# Patient Record
Sex: Female | Born: 1945 | Hispanic: No | Marital: Married | Smoking: Never smoker
Health system: Southern US, Community
[De-identification: ages and names within clinical notes are randomized; demographics above are authoritative.]

## PROBLEM LIST (undated history)

## (undated) DIAGNOSIS — K7460 Unspecified cirrhosis of liver: Secondary | ICD-10-CM

## (undated) DIAGNOSIS — F32A Depression, unspecified: Secondary | ICD-10-CM

## (undated) DIAGNOSIS — I10 Essential (primary) hypertension: Secondary | ICD-10-CM

## (undated) HISTORY — PX: APPENDECTOMY: SHX54

## (undated) HISTORY — PX: TONSILLECTOMY: SUR1361

## (undated) HISTORY — PX: ABDOMINAL HYSTERECTOMY: SHX81

---

## 2000-09-16 ENCOUNTER — Other Ambulatory Visit: Admission: RE | Admit: 2000-09-16 | Discharge: 2000-09-16 | Payer: Self-pay | Admitting: Family Medicine

## 2000-10-05 ENCOUNTER — Other Ambulatory Visit: Admission: RE | Admit: 2000-10-05 | Discharge: 2000-10-05 | Payer: Self-pay | Admitting: *Deleted

## 2000-10-27 ENCOUNTER — Other Ambulatory Visit: Admission: RE | Admit: 2000-10-27 | Discharge: 2000-10-27 | Payer: Self-pay | Admitting: *Deleted

## 2000-12-20 ENCOUNTER — Other Ambulatory Visit: Admission: RE | Admit: 2000-12-20 | Discharge: 2000-12-20 | Payer: Self-pay | Admitting: *Deleted

## 2017-08-10 DIAGNOSIS — K7469 Other cirrhosis of liver: Secondary | ICD-10-CM | POA: Diagnosis present

## 2019-09-29 DIAGNOSIS — E7800 Pure hypercholesterolemia, unspecified: Secondary | ICD-10-CM | POA: Diagnosis not present

## 2019-09-29 DIAGNOSIS — K219 Gastro-esophageal reflux disease without esophagitis: Secondary | ICD-10-CM | POA: Diagnosis not present

## 2019-09-29 DIAGNOSIS — G4700 Insomnia, unspecified: Secondary | ICD-10-CM | POA: Diagnosis not present

## 2019-09-29 DIAGNOSIS — L659 Nonscarring hair loss, unspecified: Secondary | ICD-10-CM | POA: Diagnosis not present

## 2019-09-29 DIAGNOSIS — K7460 Unspecified cirrhosis of liver: Secondary | ICD-10-CM | POA: Diagnosis not present

## 2019-09-29 DIAGNOSIS — E119 Type 2 diabetes mellitus without complications: Secondary | ICD-10-CM | POA: Diagnosis not present

## 2019-12-06 DIAGNOSIS — E118 Type 2 diabetes mellitus with unspecified complications: Secondary | ICD-10-CM | POA: Diagnosis not present

## 2020-01-03 DIAGNOSIS — G4700 Insomnia, unspecified: Secondary | ICD-10-CM | POA: Diagnosis not present

## 2020-01-03 DIAGNOSIS — E1169 Type 2 diabetes mellitus with other specified complication: Secondary | ICD-10-CM | POA: Diagnosis not present

## 2020-01-03 DIAGNOSIS — E7800 Pure hypercholesterolemia, unspecified: Secondary | ICD-10-CM | POA: Diagnosis not present

## 2020-01-03 DIAGNOSIS — Z6829 Body mass index (BMI) 29.0-29.9, adult: Secondary | ICD-10-CM | POA: Diagnosis not present

## 2020-01-03 DIAGNOSIS — Z1331 Encounter for screening for depression: Secondary | ICD-10-CM | POA: Diagnosis not present

## 2020-01-03 DIAGNOSIS — K7460 Unspecified cirrhosis of liver: Secondary | ICD-10-CM | POA: Diagnosis not present

## 2020-01-03 DIAGNOSIS — Z23 Encounter for immunization: Secondary | ICD-10-CM | POA: Diagnosis not present

## 2020-01-03 DIAGNOSIS — F322 Major depressive disorder, single episode, severe without psychotic features: Secondary | ICD-10-CM | POA: Diagnosis not present

## 2020-01-08 DIAGNOSIS — E119 Type 2 diabetes mellitus without complications: Secondary | ICD-10-CM | POA: Diagnosis not present

## 2020-01-18 DIAGNOSIS — K5730 Diverticulosis of large intestine without perforation or abscess without bleeding: Secondary | ICD-10-CM | POA: Diagnosis not present

## 2020-01-18 DIAGNOSIS — K2970 Gastritis, unspecified, without bleeding: Secondary | ICD-10-CM | POA: Diagnosis not present

## 2020-01-18 DIAGNOSIS — K8020 Calculus of gallbladder without cholecystitis without obstruction: Secondary | ICD-10-CM | POA: Diagnosis not present

## 2020-01-29 DIAGNOSIS — K7460 Unspecified cirrhosis of liver: Secondary | ICD-10-CM | POA: Diagnosis not present

## 2020-02-06 DIAGNOSIS — Z1159 Encounter for screening for other viral diseases: Secondary | ICD-10-CM | POA: Diagnosis not present

## 2020-02-07 DIAGNOSIS — E1129 Type 2 diabetes mellitus with other diabetic kidney complication: Secondary | ICD-10-CM | POA: Diagnosis not present

## 2020-02-13 DIAGNOSIS — K766 Portal hypertension: Secondary | ICD-10-CM | POA: Diagnosis not present

## 2020-02-13 DIAGNOSIS — K3189 Other diseases of stomach and duodenum: Secondary | ICD-10-CM | POA: Diagnosis not present

## 2020-02-13 DIAGNOSIS — K259 Gastric ulcer, unspecified as acute or chronic, without hemorrhage or perforation: Secondary | ICD-10-CM | POA: Diagnosis not present

## 2020-02-13 DIAGNOSIS — K7460 Unspecified cirrhosis of liver: Secondary | ICD-10-CM | POA: Diagnosis not present

## 2020-02-13 DIAGNOSIS — Z79899 Other long term (current) drug therapy: Secondary | ICD-10-CM | POA: Diagnosis not present

## 2020-02-13 DIAGNOSIS — F329 Major depressive disorder, single episode, unspecified: Secondary | ICD-10-CM | POA: Diagnosis not present

## 2020-02-13 DIAGNOSIS — I8500 Esophageal varices without bleeding: Secondary | ICD-10-CM | POA: Diagnosis not present

## 2020-02-13 DIAGNOSIS — K219 Gastro-esophageal reflux disease without esophagitis: Secondary | ICD-10-CM | POA: Diagnosis not present

## 2020-02-13 DIAGNOSIS — R935 Abnormal findings on diagnostic imaging of other abdominal regions, including retroperitoneum: Secondary | ICD-10-CM | POA: Diagnosis not present

## 2020-02-13 DIAGNOSIS — I8510 Secondary esophageal varices without bleeding: Secondary | ICD-10-CM | POA: Diagnosis not present

## 2020-03-08 DIAGNOSIS — E119 Type 2 diabetes mellitus without complications: Secondary | ICD-10-CM | POA: Diagnosis not present

## 2020-04-04 DIAGNOSIS — Z79899 Other long term (current) drug therapy: Secondary | ICD-10-CM | POA: Diagnosis not present

## 2020-04-04 DIAGNOSIS — Z6828 Body mass index (BMI) 28.0-28.9, adult: Secondary | ICD-10-CM | POA: Diagnosis not present

## 2020-04-04 DIAGNOSIS — F322 Major depressive disorder, single episode, severe without psychotic features: Secondary | ICD-10-CM | POA: Diagnosis not present

## 2020-04-04 DIAGNOSIS — K219 Gastro-esophageal reflux disease without esophagitis: Secondary | ICD-10-CM | POA: Diagnosis not present

## 2020-04-04 DIAGNOSIS — G4700 Insomnia, unspecified: Secondary | ICD-10-CM | POA: Diagnosis not present

## 2020-04-04 DIAGNOSIS — Z139 Encounter for screening, unspecified: Secondary | ICD-10-CM | POA: Diagnosis not present

## 2020-04-04 DIAGNOSIS — E1169 Type 2 diabetes mellitus with other specified complication: Secondary | ICD-10-CM | POA: Diagnosis not present

## 2020-04-04 DIAGNOSIS — E7800 Pure hypercholesterolemia, unspecified: Secondary | ICD-10-CM | POA: Diagnosis not present

## 2020-04-04 DIAGNOSIS — Z9181 History of falling: Secondary | ICD-10-CM | POA: Diagnosis not present

## 2020-04-07 DIAGNOSIS — E119 Type 2 diabetes mellitus without complications: Secondary | ICD-10-CM | POA: Diagnosis not present

## 2020-04-11 DIAGNOSIS — K7460 Unspecified cirrhosis of liver: Secondary | ICD-10-CM | POA: Diagnosis not present

## 2020-04-11 DIAGNOSIS — K5730 Diverticulosis of large intestine without perforation or abscess without bleeding: Secondary | ICD-10-CM | POA: Diagnosis not present

## 2020-04-11 DIAGNOSIS — K219 Gastro-esophageal reflux disease without esophagitis: Secondary | ICD-10-CM | POA: Diagnosis not present

## 2020-04-23 DIAGNOSIS — Z20828 Contact with and (suspected) exposure to other viral communicable diseases: Secondary | ICD-10-CM | POA: Diagnosis not present

## 2020-04-26 DIAGNOSIS — K219 Gastro-esophageal reflux disease without esophagitis: Secondary | ICD-10-CM | POA: Diagnosis not present

## 2020-04-26 DIAGNOSIS — I10 Essential (primary) hypertension: Secondary | ICD-10-CM | POA: Diagnosis not present

## 2020-04-26 DIAGNOSIS — I8500 Esophageal varices without bleeding: Secondary | ICD-10-CM | POA: Diagnosis not present

## 2020-04-26 DIAGNOSIS — E785 Hyperlipidemia, unspecified: Secondary | ICD-10-CM | POA: Diagnosis not present

## 2020-04-26 DIAGNOSIS — K7460 Unspecified cirrhosis of liver: Secondary | ICD-10-CM | POA: Diagnosis not present

## 2020-04-26 DIAGNOSIS — R935 Abnormal findings on diagnostic imaging of other abdominal regions, including retroperitoneum: Secondary | ICD-10-CM | POA: Diagnosis not present

## 2020-04-26 DIAGNOSIS — Z6831 Body mass index (BMI) 31.0-31.9, adult: Secondary | ICD-10-CM | POA: Diagnosis not present

## 2020-04-26 DIAGNOSIS — Z79899 Other long term (current) drug therapy: Secondary | ICD-10-CM | POA: Diagnosis not present

## 2020-05-07 DIAGNOSIS — E119 Type 2 diabetes mellitus without complications: Secondary | ICD-10-CM | POA: Diagnosis not present

## 2020-06-06 DIAGNOSIS — E119 Type 2 diabetes mellitus without complications: Secondary | ICD-10-CM | POA: Diagnosis not present

## 2020-07-04 DIAGNOSIS — K7460 Unspecified cirrhosis of liver: Secondary | ICD-10-CM | POA: Diagnosis not present

## 2020-07-04 DIAGNOSIS — F32A Depression, unspecified: Secondary | ICD-10-CM | POA: Diagnosis not present

## 2020-07-04 DIAGNOSIS — E1169 Type 2 diabetes mellitus with other specified complication: Secondary | ICD-10-CM | POA: Diagnosis not present

## 2020-07-04 DIAGNOSIS — E7800 Pure hypercholesterolemia, unspecified: Secondary | ICD-10-CM | POA: Diagnosis not present

## 2020-07-04 DIAGNOSIS — R42 Dizziness and giddiness: Secondary | ICD-10-CM | POA: Diagnosis not present

## 2020-07-04 DIAGNOSIS — Z6828 Body mass index (BMI) 28.0-28.9, adult: Secondary | ICD-10-CM | POA: Diagnosis not present

## 2020-07-04 DIAGNOSIS — G4700 Insomnia, unspecified: Secondary | ICD-10-CM | POA: Diagnosis not present

## 2020-07-04 DIAGNOSIS — K219 Gastro-esophageal reflux disease without esophagitis: Secondary | ICD-10-CM | POA: Diagnosis not present

## 2020-07-05 DIAGNOSIS — H2513 Age-related nuclear cataract, bilateral: Secondary | ICD-10-CM | POA: Diagnosis not present

## 2020-07-05 DIAGNOSIS — E119 Type 2 diabetes mellitus without complications: Secondary | ICD-10-CM | POA: Diagnosis not present

## 2020-07-06 DIAGNOSIS — E119 Type 2 diabetes mellitus without complications: Secondary | ICD-10-CM | POA: Diagnosis not present

## 2020-07-09 DIAGNOSIS — Z0100 Encounter for examination of eyes and vision without abnormal findings: Secondary | ICD-10-CM | POA: Diagnosis not present

## 2020-08-05 DIAGNOSIS — E119 Type 2 diabetes mellitus without complications: Secondary | ICD-10-CM | POA: Diagnosis not present

## 2020-09-04 DIAGNOSIS — E119 Type 2 diabetes mellitus without complications: Secondary | ICD-10-CM | POA: Diagnosis not present

## 2020-10-04 DIAGNOSIS — E1169 Type 2 diabetes mellitus with other specified complication: Secondary | ICD-10-CM | POA: Diagnosis not present

## 2020-10-04 DIAGNOSIS — K7460 Unspecified cirrhosis of liver: Secondary | ICD-10-CM | POA: Diagnosis not present

## 2020-10-04 DIAGNOSIS — E119 Type 2 diabetes mellitus without complications: Secondary | ICD-10-CM | POA: Diagnosis not present

## 2020-10-04 DIAGNOSIS — N1831 Chronic kidney disease, stage 3a: Secondary | ICD-10-CM | POA: Diagnosis not present

## 2020-10-04 DIAGNOSIS — E7800 Pure hypercholesterolemia, unspecified: Secondary | ICD-10-CM | POA: Diagnosis not present

## 2020-10-05 DIAGNOSIS — E119 Type 2 diabetes mellitus without complications: Secondary | ICD-10-CM | POA: Diagnosis not present

## 2020-10-14 DIAGNOSIS — Z1231 Encounter for screening mammogram for malignant neoplasm of breast: Secondary | ICD-10-CM | POA: Diagnosis not present

## 2020-10-28 DIAGNOSIS — I8500 Esophageal varices without bleeding: Secondary | ICD-10-CM | POA: Diagnosis not present

## 2020-11-04 DIAGNOSIS — E119 Type 2 diabetes mellitus without complications: Secondary | ICD-10-CM | POA: Diagnosis not present

## 2020-12-05 DIAGNOSIS — E119 Type 2 diabetes mellitus without complications: Secondary | ICD-10-CM | POA: Diagnosis not present

## 2021-01-04 DIAGNOSIS — E119 Type 2 diabetes mellitus without complications: Secondary | ICD-10-CM | POA: Diagnosis not present

## 2021-01-06 DIAGNOSIS — I10 Essential (primary) hypertension: Secondary | ICD-10-CM | POA: Diagnosis not present

## 2021-01-06 DIAGNOSIS — K7460 Unspecified cirrhosis of liver: Secondary | ICD-10-CM | POA: Diagnosis not present

## 2021-01-06 DIAGNOSIS — Z23 Encounter for immunization: Secondary | ICD-10-CM | POA: Diagnosis not present

## 2021-01-06 DIAGNOSIS — N1831 Chronic kidney disease, stage 3a: Secondary | ICD-10-CM | POA: Diagnosis not present

## 2021-01-06 DIAGNOSIS — E7800 Pure hypercholesterolemia, unspecified: Secondary | ICD-10-CM | POA: Diagnosis not present

## 2021-01-06 DIAGNOSIS — E1169 Type 2 diabetes mellitus with other specified complication: Secondary | ICD-10-CM | POA: Diagnosis not present

## 2021-01-06 DIAGNOSIS — G4700 Insomnia, unspecified: Secondary | ICD-10-CM | POA: Diagnosis not present

## 2021-02-04 DIAGNOSIS — E119 Type 2 diabetes mellitus without complications: Secondary | ICD-10-CM | POA: Diagnosis not present

## 2021-03-03 ENCOUNTER — Other Ambulatory Visit: Payer: Self-pay

## 2021-03-03 ENCOUNTER — Encounter (HOSPITAL_COMMUNITY): Payer: Self-pay | Admitting: Family Medicine

## 2021-03-03 ENCOUNTER — Emergency Department (HOSPITAL_COMMUNITY): Payer: Medicare Other

## 2021-03-03 ENCOUNTER — Inpatient Hospital Stay (HOSPITAL_COMMUNITY)
Admission: EM | Admit: 2021-03-03 | Discharge: 2021-03-07 | DRG: 522 | Disposition: A | Discharge status: Skilled Nursing Facility | Payer: Medicare Other | Attending: Internal Medicine | Admitting: Internal Medicine

## 2021-03-03 DIAGNOSIS — W19XXXA Unspecified fall, initial encounter: Secondary | ICD-10-CM | POA: Diagnosis not present

## 2021-03-03 DIAGNOSIS — F419 Anxiety disorder, unspecified: Secondary | ICD-10-CM | POA: Diagnosis not present

## 2021-03-03 DIAGNOSIS — E785 Hyperlipidemia, unspecified: Secondary | ICD-10-CM | POA: Diagnosis present

## 2021-03-03 DIAGNOSIS — R739 Hyperglycemia, unspecified: Secondary | ICD-10-CM | POA: Diagnosis present

## 2021-03-03 DIAGNOSIS — D62 Acute posthemorrhagic anemia: Secondary | ICD-10-CM | POA: Diagnosis not present

## 2021-03-03 DIAGNOSIS — D649 Anemia, unspecified: Secondary | ICD-10-CM | POA: Diagnosis present

## 2021-03-03 DIAGNOSIS — E119 Type 2 diabetes mellitus without complications: Secondary | ICD-10-CM | POA: Diagnosis not present

## 2021-03-03 DIAGNOSIS — S72041A Displaced fracture of base of neck of right femur, initial encounter for closed fracture: Secondary | ICD-10-CM | POA: Diagnosis not present

## 2021-03-03 DIAGNOSIS — S72041D Displaced fracture of base of neck of right femur, subsequent encounter for closed fracture with routine healing: Secondary | ICD-10-CM | POA: Diagnosis not present

## 2021-03-03 DIAGNOSIS — Z9181 History of falling: Secondary | ICD-10-CM | POA: Diagnosis not present

## 2021-03-03 DIAGNOSIS — S79929A Unspecified injury of unspecified thigh, initial encounter: Secondary | ICD-10-CM | POA: Diagnosis not present

## 2021-03-03 DIAGNOSIS — W109XXA Fall (on) (from) unspecified stairs and steps, initial encounter: Secondary | ICD-10-CM | POA: Diagnosis present

## 2021-03-03 DIAGNOSIS — R6889 Other general symptoms and signs: Secondary | ICD-10-CM | POA: Diagnosis not present

## 2021-03-03 DIAGNOSIS — F32A Depression, unspecified: Secondary | ICD-10-CM | POA: Diagnosis not present

## 2021-03-03 DIAGNOSIS — R7302 Impaired glucose tolerance (oral): Secondary | ICD-10-CM | POA: Diagnosis present

## 2021-03-03 DIAGNOSIS — Z4789 Encounter for other orthopedic aftercare: Secondary | ICD-10-CM | POA: Diagnosis not present

## 2021-03-03 DIAGNOSIS — Z20822 Contact with and (suspected) exposure to covid-19: Secondary | ICD-10-CM | POA: Diagnosis present

## 2021-03-03 DIAGNOSIS — M1612 Unilateral primary osteoarthritis, left hip: Secondary | ICD-10-CM | POA: Diagnosis not present

## 2021-03-03 DIAGNOSIS — R9431 Abnormal electrocardiogram [ECG] [EKG]: Secondary | ICD-10-CM | POA: Diagnosis not present

## 2021-03-03 DIAGNOSIS — I080 Rheumatic disorders of both mitral and aortic valves: Secondary | ICD-10-CM | POA: Diagnosis present

## 2021-03-03 DIAGNOSIS — M25572 Pain in left ankle and joints of left foot: Secondary | ICD-10-CM | POA: Diagnosis not present

## 2021-03-03 DIAGNOSIS — Z743 Need for continuous supervision: Secondary | ICD-10-CM | POA: Diagnosis not present

## 2021-03-03 DIAGNOSIS — S79911A Unspecified injury of right hip, initial encounter: Secondary | ICD-10-CM | POA: Diagnosis not present

## 2021-03-03 DIAGNOSIS — K760 Fatty (change of) liver, not elsewhere classified: Secondary | ICD-10-CM | POA: Diagnosis present

## 2021-03-03 DIAGNOSIS — Z471 Aftercare following joint replacement surgery: Secondary | ICD-10-CM | POA: Diagnosis not present

## 2021-03-03 DIAGNOSIS — R0902 Hypoxemia: Secondary | ICD-10-CM | POA: Diagnosis not present

## 2021-03-03 DIAGNOSIS — S72001A Fracture of unspecified part of neck of right femur, initial encounter for closed fracture: Principal | ICD-10-CM | POA: Diagnosis present

## 2021-03-03 DIAGNOSIS — M25551 Pain in right hip: Secondary | ICD-10-CM | POA: Diagnosis not present

## 2021-03-03 DIAGNOSIS — S72002A Fracture of unspecified part of neck of left femur, initial encounter for closed fracture: Secondary | ICD-10-CM | POA: Diagnosis not present

## 2021-03-03 DIAGNOSIS — Z79899 Other long term (current) drug therapy: Secondary | ICD-10-CM | POA: Diagnosis not present

## 2021-03-03 DIAGNOSIS — Z96641 Presence of right artificial hip joint: Secondary | ICD-10-CM | POA: Diagnosis not present

## 2021-03-03 DIAGNOSIS — M6281 Muscle weakness (generalized): Secondary | ICD-10-CM | POA: Diagnosis not present

## 2021-03-03 DIAGNOSIS — R2689 Other abnormalities of gait and mobility: Secondary | ICD-10-CM | POA: Diagnosis not present

## 2021-03-03 DIAGNOSIS — I517 Cardiomegaly: Secondary | ICD-10-CM | POA: Diagnosis not present

## 2021-03-03 DIAGNOSIS — Z419 Encounter for procedure for purposes other than remedying health state, unspecified: Secondary | ICD-10-CM

## 2021-03-03 HISTORY — DX: Depression, unspecified: F32.A

## 2021-03-03 HISTORY — DX: Essential (primary) hypertension: I10

## 2021-03-03 HISTORY — DX: Unspecified cirrhosis of liver: K74.60

## 2021-03-03 LAB — BASIC METABOLIC PANEL
Anion gap: 9 (ref 5–15)
BUN: 15 mg/dL (ref 8–23)
CO2: 26 mmol/L (ref 22–32)
Calcium: 8.9 mg/dL (ref 8.9–10.3)
Chloride: 103 mmol/L (ref 98–111)
Creatinine, Ser: 1 mg/dL (ref 0.44–1.00)
GFR, Estimated: 59 mL/min — ABNORMAL LOW (ref >60)
Glucose, Bld: 164 mg/dL — ABNORMAL HIGH (ref 70–99)
Potassium: 4.3 mmol/L (ref 3.5–5.1)
Sodium: 138 mmol/L (ref 135–145)

## 2021-03-03 LAB — CBC WITH DIFFERENTIAL/PLATELET
Abs Immature Granulocytes: 0.03 10*3/uL (ref 0.00–0.07)
Basophils Absolute: 0 10*3/uL (ref 0.0–0.1)
Basophils Relative: 0 %
Eosinophils Absolute: 0.2 10*3/uL (ref 0.0–0.5)
Eosinophils Relative: 3 %
HCT: 35.4 % — ABNORMAL LOW (ref 36.0–46.0)
Hemoglobin: 10.7 g/dL — ABNORMAL LOW (ref 12.0–15.0)
Immature Granulocytes: 1 %
Lymphocytes Relative: 10 %
Lymphs Abs: 0.7 10*3/uL (ref 0.7–4.0)
MCH: 28.2 pg (ref 26.0–34.0)
MCHC: 30.2 g/dL (ref 30.0–36.0)
MCV: 93.2 fL (ref 80.0–100.0)
Monocytes Absolute: 0.3 10*3/uL (ref 0.1–1.0)
Monocytes Relative: 4 %
Neutro Abs: 5.5 10*3/uL (ref 1.7–7.7)
Neutrophils Relative %: 82 %
Platelets: 111 10*3/uL — ABNORMAL LOW (ref 150–400)
RBC: 3.8 MIL/uL — ABNORMAL LOW (ref 3.87–5.11)
RDW: 15.4 % (ref 11.5–15.5)
WBC: 6.7 10*3/uL (ref 4.0–10.5)
nRBC: 0 % (ref 0.0–0.2)

## 2021-03-03 LAB — RESP PANEL BY RT-PCR (FLU A&B, COVID) ARPGX2
Influenza A by PCR: NEGATIVE
Influenza B by PCR: NEGATIVE
SARS Coronavirus 2 by RT PCR: NEGATIVE

## 2021-03-03 LAB — ABO/RH: ABO/RH(D): O POS

## 2021-03-03 LAB — PROTIME-INR
INR: 1.1 (ref 0.8–1.2)
Prothrombin Time: 14.6 seconds (ref 11.4–15.2)

## 2021-03-03 MED ORDER — FENTANYL CITRATE PF 50 MCG/ML IJ SOSY
50.0000 ug | PREFILLED_SYRINGE | INTRAMUSCULAR | Status: AC | PRN
  Administered 2021-03-03 – 2021-03-04 (×2): 50 ug via INTRAVENOUS
  Filled 2021-03-03 (×2): qty 1

## 2021-03-03 NOTE — Progress Notes (Signed)
Ortho Note  Received consult regarding this patient. She has a displaced femoral neck fracture and will require arthroplasty. I have asked for admission to the hospitalist with plans for NPO at midnight. Surgery will be tomorrow pending surgeon and OR availability. Formal consult to follow tomorrow AM.  Roby Lofts, MD [facility] [phone] (office) [email or web address]

## 2021-03-03 NOTE — H&P (Signed)
History and Physical    Alexis Cox ERD:[id number] DOB: [birth year] DOA: 03/03/2021  PCP: No primary care provider on file.   Patient coming from: Home  Chief Complaint: Right hip pain after fall  HPI: Alexis Cox is a 75 y.o. female with medical history significant for impaired glucose tolerance is diet controlled, fatty liver disease who presents by EMS after a fall at home.  Approximate hour before arrival to the emergency room patient was walking in her house when she fell landing on her right side.  She instantaneously had severe pain in her right hip and was unable to stand up.  Pain was sharp.  Was brought to the hospital by EMS and found to have a right femoral neck fracture.  He was given pain medication by EMS personnel.  Pain is controlled as long as she is laying still not moving her right leg.  She has normal feeling in her right leg and normal pulses in both feet.  She denies any head injury or loss of consciousness.  Chest pain, palpitation, shortness of breath.  No recent fever or illness. Lives with her husband.  Denies tobacco alcohol or illicit drug use.  ED Course: She has been hemodynamically stable in the emergency room.  She is found to have right femoral neck fracture that is displaced.  Orthopedic surgeries been consulted and will see patient and prepare for operative repair.  EKG shows no ischemic changes.  COVID swab is pending.  Sodium 138 potassium 4.3 chloride 103 bicarb 26 creatinine 1.00 BUN 15 glucose 164 INR 1.1.  CBC pending.  Hospitalist service has been asked to admit patient for further management  Review of Systems:  General: Denies fever, chills, weight loss, night sweats.  Denies dizziness.  Denies change in appetite HENT: Denies head trauma, headache, denies change in hearing, tinnitus.  Denies nasal congestion or bleeding.  Denies sore throat, sores in mouth.  Denies difficulty swallowing Eyes: Denies blurry vision, pain in eye, drainage.   Denies discoloration of eyes. Neck: Denies pain.  Denies swelling.  Denies pain with movement. Cardiovascular: Denies chest pain, palpitations.  Denies edema.  Denies orthopnea Respiratory: Denies shortness of breath, cough.  Denies wheezing.  Denies sputum production Gastrointestinal: Denies abdominal pain, swelling.  Denies nausea, vomiting, diarrhea.  Denies melena.  Denies hematemesis. Musculoskeletal: Reports limitation of movement right leg.  Pain in right hip..  Denies deformity or swelling.   Genitourinary: Denies pelvic pain.  Denies urinary frequency or hesitancy.  Denies dysuria.  Skin: Denies rash.  Denies petechiae, purpura, ecchymosis. Neurological: Denies syncope.  Denies seizure activity. Denies paresthesia. Denies slurred speech, drooping face.  Denies visual change. Psychiatric: Denies depression, anxiety. Denies hallucinations.  History reviewed. No pertinent past medical history.  History reviewed. No pertinent surgical history.  Social History  reports that she has never smoked. She has never used smokeless tobacco. She reports that she does not drink alcohol. No history on file for drug use.  No Known Allergies  History reviewed. No pertinent family history.   Prior to Admission medications   Not on File    Physical Exam: Vitals:   03/03/21 1902  BP: (!) 151/89  Pulse: 64  Resp: 16  Temp: (!) 97.4 F (36.3 C)  TempSrc: Oral  SpO2: 94%    Constitutional: NAD, calm, comfortable Vitals:   03/03/21 1902  BP: (!) 151/89  Pulse: 64  Resp: 16  Temp: (!) 97.4 F (36.3 C)  TempSrc: Oral  SpO2: 94%  General: WDWN, Alert and oriented x3.  Eyes: EOMI, PERRL, conjunctivae normal.  Sclera nonicteric HENT:  [place]/AT, external ears normal.  Nares patent without epistasis.  Mucous membranes are moist.  Neck: Soft, normal range of motion, supple, no masses, no thyromegaly.  Trachea midline Respiratory: clear to auscultation bilaterally, no wheezing, no  crackles. Normal respiratory effort. No accessory muscle use.  Cardiovascular: Regular rate and rhythm, no murmurs / rubs / gallops. No extremity edema. 2+ pedal pulses. Abdomen: Soft, no tenderness, nondistended, no rebound or guarding.  No masses palpated. Bowel sounds normoactive Musculoskeletal: Right leg shortened and externally rotated.  All other extremities with normal range of motion no cyanosis. No joint deformity upper and lower extremities. Normal muscle tone.  Skin: Warm, dry, intact no rashes, lesions, ulcers. No induration Neurologic: CN 2-12 grossly intact.  Normal speech.  Sensation intact, Strength 5/5 in upper extremities and left lower leg. Psychiatric: Normal judgment and insight.  Normal mood.    Labs on Admission: I have personally reviewed following labs and imaging studies  CBC: No results for input(s): WBC, NEUTROABS, HGB, HCT, MCV, PLT in the last 168 hours.  Basic Metabolic Panel: Recent Labs  Lab 03/03/21 2058  NA 138  K 4.3  CL 103  CO2 26  GLUCOSE 164*  BUN 15  CREATININE 1.00  CALCIUM 8.9    GFR: CrCl cannot be calculated (Unknown ideal weight.).  Liver Function Tests: No results for input(s): AST, ALT, ALKPHOS, BILITOT, PROT, ALBUMIN in the last 168 hours.  Urine analysis: No results found for: COLORURINE, APPEARANCEUR, LABSPEC, PHURINE, GLUCOSEU, HGBUR, BILIRUBINUR, KETONESUR, PROTEINUR, UROBILINOGEN, NITRITE, LEUKOCYTESUR  Radiological Exams on Admission: DG Chest 1 View  Result Date: 03/03/2021 CLINICAL DATA:  Initial evaluation for preoperative evaluation. EXAM: CHEST  1 VIEW COMPARISON:  None available. FINDINGS: Cardiomegaly. Mediastinal silhouette within normal limits. Aortic atherosclerosis. Lungs mildly hypoinflated. Chronic coarsening of the interstitial markings with diffuse pulmonary interstitial congestion. No overt pulmonary edema. No pleural effusion. No pneumothorax. No acute osseous finding. IMPRESSION: 1. Cardiomegaly with  diffuse pulmonary interstitial congestion without overt pulmonary edema. 2.  Aortic Atherosclerosis (ICD10-I70.0). Electronically Signed   By: Rise Mu M.D.   On: 03/03/2021 20:10   DG Hip Unilat With Pelvis 2-3 Views Right  Result Date: 03/03/2021 CLINICAL DATA:  Fall with hip pain EXAM: DG HIP (WITH OR WITHOUT PELVIS) 2-3V RIGHT COMPARISON:  None. FINDINGS: SI joints are non widened. Pubic symphysis and rami appear intact. Acute right femoral neck fracture with mild displacement and angulation. IMPRESSION: Acute displaced and mildly angulated right femoral neck fracture Electronically Signed   By: Jasmine Pang M.D.   On: 03/03/2021 20:10    EKG: Independently reviewed.  EKG shows normal sinus rhythm with no acute ST elevation or depression.  QTC prolonged at 481  Assessment/Plan Principal Problem:   Closed displaced fracture of right femoral neck  Alexis Cox is admitted to Med Surg floor.  Pain control with Dilaudid overnight.  Orthopedics consulted and plans to repair in OR tomorrow.  NPO after midnight per Ortho request although unknown what time will go to OR at this point.  IVF hydration with LR at 100 ml/hr overnight. Will need PT evaluation after repair of hip fracture Will need discharge planning to follow as patient may need inpatient rehab after surgery  Active Problems:   Impaired glucose tolerance History of impaired glucose tolerance that is controlled by diet.  Recheck blood sugar in the morning with labs    Prolonged QT  interval Avoid medications or could further prolong QT interval   DVT prophylaxis: SCDs for DVT prophylaxis overnight  Code Status:   Full Code  Family Communication:  Diagnosis and plan discussed with patient and her family daughter at bedside.  Questions answered.  They verbalized understanding and agree with plan.  Further recommendations to follow as clinically indicated. Disposition Plan:   Patient is from:  Home  Anticipated DC  to:  Home vs rehab, to be determined  Anticipated DC date:  Anticipate 2 midnight or more stay in the hospital   Consults called:  Orthopedics  Admission status:  Inpatient   Claudean Severance Cordell Coke MD [facility]  How to contact the [facility] Attending or Consulting provider 7A - 7P or covering provider during after hours 7P -7A, for this patient?   Check the care team in [facility] and look for a) attending/consulting [facility] provider listed and b) the [facility] team listed Log into [email or web address] and use [place]'s universal password to access. If you do not have the password, please contact the hospital operator. Locate the [facility] provider you are looking for under [facility] and page to a number that you can be directly reached. If you still have difficulty reaching the provider, please page the [facility] (Director on Call) for the Hospitalists listed on amion for assistance.  03/03/2021, 9:56 PM

## 2021-03-03 NOTE — ED Provider Notes (Signed)
[facility] Provider Note   CSN: [id number] Arrival date & time: 03/03/21  1854     History Chief Complaint  Patient presents with   Alexis Cox is a 75 y.o. female.  The history is provided by the patient.  Leg Pain Location:  Hip Time since incident:  1 hour Injury: yes   Mechanism of injury: fall   Fall:    Fall occurred:  Standing   Point of impact:  Buttocks Hip location:  R hip Pain details:    Quality:  Aching   Radiates to:  R leg   Severity:  Severe   Onset quality:  Sudden   Duration:  1 hour   Timing:  Constant   Progression:  Unchanged Dislocation: no   Prior injury to area:  No Exacerbated by: movement. Associated symptoms: no back pain and no fever       History reviewed. No pertinent past medical history.  Patient Active Problem List   Diagnosis Date Noted   Closed displaced fracture of right femoral neck ([place]) 03/03/2021   Prolonged QT interval 03/03/2021   Impaired glucose tolerance 03/03/2021     History reviewed. No pertinent surgical history.   OB History   No obstetric history on file.     History reviewed. No pertinent family history.  Social History   Tobacco Use   Smoking status: Never   Smokeless tobacco: Never  Substance Use Topics   Alcohol use: Never    Home Medications Prior to Admission medications   Medication Sig Start Date End Date Taking? Authorizing Provider  ezetimibe (ZETIA) 10 MG tablet Take 10 mg by mouth daily.   Yes [provider]  Multiple Vitamin (MULTI VITAMIN DAILY PO) Take 2 tablets by mouth daily. gummy   Yes [provider]  omeprazole (PRILOSEC) 20 MG capsule Take 20 mg by mouth daily.   Yes [provider]  traZODone (DESYREL) 50 MG tablet Take 50 mg by mouth at bedtime.   Yes [provider]    Allergies    Patient has no known allergies.  Review of Systems   Review of Systems  Constitutional:   Negative for chills and fever.  HENT:  Negative for ear pain and sore throat.   Eyes:  Negative for pain and visual disturbance.  Respiratory:  Negative for cough.   Cardiovascular:  Negative for palpitations.  Gastrointestinal:  Negative for vomiting.  Genitourinary:  Negative for dysuria and hematuria.  Musculoskeletal:  Negative for arthralgias and back pain.  Skin:  Negative for color change and rash.  Neurological:  Negative for seizures and syncope.  All other systems reviewed and are negative.  Physical Exam Updated Vital Signs BP (!) 151/89 (BP Location: Left Arm)   Pulse 64   Temp (!) 97.4 F (36.3 C) (Oral)   Resp 16   SpO2 94%   Physical Exam Vitals and nursing note reviewed.  Constitutional:      General: She is not in acute distress.    Appearance: Normal appearance. She is well-developed.  HENT:     Head: Normocephalic and atraumatic.     Right Ear: External ear normal.     Left Ear: External ear normal.     Nose: Nose normal. No congestion or rhinorrhea.     Mouth/Throat:     Mouth: Mucous membranes are moist.  Eyes:     Extraocular Movements: Extraocular movements intact.  Conjunctiva/sclera: Conjunctivae normal.     Pupils: Pupils are equal, round, and reactive to light.  Cardiovascular:     Rate and Rhythm: Normal rate and regular rhythm.     Pulses: Normal pulses.     Heart sounds: No murmur heard. Pulmonary:     Effort: Pulmonary effort is normal. No respiratory distress.     Breath sounds: Normal breath sounds. No wheezing, rhonchi or rales.  Abdominal:     General: Abdomen is flat. Bowel sounds are normal.     Palpations: Abdomen is soft.     Tenderness: There is no abdominal tenderness. There is no guarding or rebound.  Musculoskeletal:        General: Tenderness (Diffuse R hip tenderness to palpation w/out overlying wounds or skin changes. Limited ROM 2/2 pain. Full ROM R knee and ankle. 2+ DP and PT pulses. Sensation intact throughout.) and  deformity (minimal shortening and external rotation of R leg) present. No swelling.     Cervical back: Normal range of motion and neck supple. No rigidity.  Skin:    General: Skin is warm and dry.     Capillary Refill: Capillary refill takes less than 2 seconds.  Neurological:     General: No focal deficit present.     Mental Status: She is alert and oriented to person, place, and time.     Cranial Nerves: No cranial nerve deficit.     Sensory: No sensory deficit.     Motor: No weakness.     Coordination: Coordination normal.     Gait: Gait normal.  Psychiatric:        Mood and Affect: Mood normal.    ED Results / Procedures / Treatments   Labs (all labs ordered are listed, but only abnormal results are displayed) Labs Reviewed  BASIC METABOLIC PANEL - Abnormal; Notable for the following components:      Result Value   Glucose, Bld 164 (*)    GFR, Estimated 59 (*)    All other components within normal limits  CBC WITH DIFFERENTIAL/PLATELET - Abnormal; Notable for the following components:   RBC 3.80 (*)    Hemoglobin 10.7 (*)    HCT 35.4 (*)    Platelets 111 (*)    All other components within normal limits  RESP PANEL BY RT-PCR (FLU A&B, COVID) [id number]  PROTIME-INR  TYPE AND SCREEN  ABO/RH    EKG None  Radiology DG Chest 1 View  Result Date: 03/03/2021 CLINICAL DATA:  Initial evaluation for preoperative evaluation. EXAM: CHEST  1 VIEW COMPARISON:  None available. FINDINGS: Cardiomegaly. Mediastinal silhouette within normal limits. Aortic atherosclerosis. Lungs mildly hypoinflated. Chronic coarsening of the interstitial markings with diffuse pulmonary interstitial congestion. No overt pulmonary edema. No pleural effusion. No pneumothorax. No acute osseous finding. IMPRESSION: 1. Cardiomegaly with diffuse pulmonary interstitial congestion without overt pulmonary edema. 2.  Aortic Atherosclerosis (ICD10-I70.0). Electronically Signed   By: Rise Mu M.D.   On:  03/03/2021 20:10   DG Hip Unilat With Pelvis 2-3 Views Right  Result Date: 03/03/2021 CLINICAL DATA:  Fall with hip pain EXAM: DG HIP (WITH OR WITHOUT PELVIS) 2-3V RIGHT COMPARISON:  None. FINDINGS: SI joints are non widened. Pubic symphysis and rami appear intact. Acute right femoral neck fracture with mild displacement and angulation. IMPRESSION: Acute displaced and mildly angulated right femoral neck fracture Electronically Signed   By: Jasmine Pang M.D.   On: 03/03/2021 20:10    Procedures Procedures   Medications Ordered in ED  Medications  fentaNYL (SUBLIMAZE) injection 50 mcg (50 mcg Intravenous Given 03/03/21 2157)    ED Course  I have reviewed the triage vital signs and the nursing notes.  Pertinent labs & imaging results that were available during my care of the patient were reviewed by me and considered in my medical decision making (see chart for details).  Clinical Course as of 03/03/21 2341  Mon Mar 03, 2021  2350 75 year old female complaining of right hip pain after fall while she was going up the steps with her groceries.  She denies any head injury or loss consciousness.  She is not having any pain if she stays still.  Getting labs EKG and imaging.  Anticipate will need admission. [MB]    Clinical Course User Index [MB] Terrilee Files, MD   MDM Rules/Calculators/A&P                         75 y/o female presenting w/ R hip pain s/p mechanical fall.  No other signs of injury.  She not hit her head or lose consciousness.  She is not on any anticoagulation.  Found to have right femoral neck fracture.  She is neurovascular intact.  No concern for open fracture.  Orthopedics consulted.  Recommend n.p.o. at midnight for likely operative fixation in the morning.  Medicine team contacted for admission.  Final Clinical Impression(s) / ED Diagnoses Final diagnoses:  Closed displaced fracture of right femoral neck (HCC)  Fall, initial encounter    Rx / DC Orders ED  Discharge Orders     None        Lutricia Feil, MD 03/03/21 2341    Terrilee Files, MD 03/04/21 1042

## 2021-03-03 NOTE — ED Triage Notes (Signed)
PT here via [facility] after tripping and falling down 3 stairs.  No loc.  L hip shortened and rotated.  Pulses present.  Given fentanyl en-route which improved pain from 10/10 to 3.  Pt alert to self, but not year.

## 2021-03-04 ENCOUNTER — Inpatient Hospital Stay (HOSPITAL_COMMUNITY): Payer: Medicare Other

## 2021-03-04 ENCOUNTER — Encounter (HOSPITAL_COMMUNITY)
Admission: EM | Disposition: A | Discharge status: Skilled Nursing Facility | Payer: Self-pay | Source: Home / Self Care | Attending: Internal Medicine

## 2021-03-04 ENCOUNTER — Inpatient Hospital Stay (HOSPITAL_COMMUNITY): Payer: Medicare Other | Admitting: Anesthesiology

## 2021-03-04 ENCOUNTER — Encounter (HOSPITAL_COMMUNITY): Payer: Self-pay | Admitting: Family Medicine

## 2021-03-04 DIAGNOSIS — I517 Cardiomegaly: Secondary | ICD-10-CM

## 2021-03-04 DIAGNOSIS — K760 Fatty (change of) liver, not elsewhere classified: Secondary | ICD-10-CM | POA: Diagnosis not present

## 2021-03-04 DIAGNOSIS — D649 Anemia, unspecified: Secondary | ICD-10-CM

## 2021-03-04 DIAGNOSIS — Z20822 Contact with and (suspected) exposure to covid-19: Secondary | ICD-10-CM | POA: Diagnosis not present

## 2021-03-04 DIAGNOSIS — S72041A Displaced fracture of base of neck of right femur, initial encounter for closed fracture: Secondary | ICD-10-CM

## 2021-03-04 DIAGNOSIS — S72001A Fracture of unspecified part of neck of right femur, initial encounter for closed fracture: Principal | ICD-10-CM

## 2021-03-04 DIAGNOSIS — D62 Acute posthemorrhagic anemia: Secondary | ICD-10-CM | POA: Diagnosis not present

## 2021-03-04 DIAGNOSIS — R9431 Abnormal electrocardiogram [ECG] [EKG]: Secondary | ICD-10-CM

## 2021-03-04 HISTORY — PX: TOTAL HIP ARTHROPLASTY: SHX124

## 2021-03-04 LAB — BASIC METABOLIC PANEL
Anion gap: 9 (ref 5–15)
BUN: 15 mg/dL (ref 8–23)
CO2: 25 mmol/L (ref 22–32)
Calcium: 8.9 mg/dL (ref 8.9–10.3)
Chloride: 104 mmol/L (ref 98–111)
Creatinine, Ser: 1.05 mg/dL — ABNORMAL HIGH (ref 0.44–1.00)
GFR, Estimated: 55 mL/min — ABNORMAL LOW (ref >60)
Glucose, Bld: 210 mg/dL — ABNORMAL HIGH (ref 70–99)
Potassium: 3.9 mmol/L (ref 3.5–5.1)
Sodium: 138 mmol/L (ref 135–145)

## 2021-03-04 LAB — CBC
HCT: 34.3 % — ABNORMAL LOW (ref 36.0–46.0)
Hemoglobin: 10.6 g/dL — ABNORMAL LOW (ref 12.0–15.0)
MCH: 28.3 pg (ref 26.0–34.0)
MCHC: 30.9 g/dL (ref 30.0–36.0)
MCV: 91.5 fL (ref 80.0–100.0)
Platelets: 110 10*3/uL — ABNORMAL LOW (ref 150–400)
RBC: 3.75 MIL/uL — ABNORMAL LOW (ref 3.87–5.11)
RDW: 15.2 % (ref 11.5–15.5)
WBC: 6.8 10*3/uL (ref 4.0–10.5)
nRBC: 0 % (ref 0.0–0.2)

## 2021-03-04 LAB — GLUCOSE, CAPILLARY
Glucose-Capillary: 162 mg/dL — ABNORMAL HIGH (ref 70–99)
Glucose-Capillary: 184 mg/dL — ABNORMAL HIGH (ref 70–99)

## 2021-03-04 LAB — HEMOGLOBIN A1C
Hgb A1c MFr Bld: 6.3 % — ABNORMAL HIGH (ref 4.8–5.6)
Mean Plasma Glucose: 134 mg/dL

## 2021-03-04 LAB — SURGICAL PCR SCREEN
MRSA, PCR: NEGATIVE
Staphylococcus aureus: NEGATIVE

## 2021-03-04 SURGERY — ARTHROPLASTY, HIP, TOTAL, ANTERIOR APPROACH
Anesthesia: Spinal | Site: Hip | Laterality: Right

## 2021-03-04 MED ORDER — SENNOSIDES-DOCUSATE SODIUM 8.6-50 MG PO TABS: 1.0000 | ORAL_TABLET | Freq: Every evening | ORAL | Status: DC | PRN

## 2021-03-04 MED ORDER — LACTATED RINGERS IV SOLN: INTRAVENOUS | Status: DC

## 2021-03-04 MED ORDER — PROCHLORPERAZINE EDISYLATE 10 MG/2ML IJ SOLN
10.0000 mg | Freq: Four times a day (QID) | INTRAMUSCULAR | Status: DC | PRN
  Administered 2021-03-04: 10 mg via INTRAVENOUS

## 2021-03-04 MED ORDER — PROPOFOL 10 MG/ML IV BOLUS
INTRAVENOUS | Status: DC | PRN
  Administered 2021-03-04 (×3): 20 mg via INTRAVENOUS
  Administered 2021-03-04: 50 ug/kg/min via INTRAVENOUS

## 2021-03-04 MED ORDER — SODIUM CHLORIDE 0.9 % IR SOLN
Status: DC | PRN
  Administered 2021-03-04: 1000 mL

## 2021-03-04 MED ORDER — OXYCODONE HCL 5 MG PO TABS
5.0000 mg | ORAL_TABLET | ORAL | Status: DC | PRN
  Administered 2021-03-05: 5 mg via ORAL
  Administered 2021-03-05: 10 mg via ORAL
  Administered 2021-03-05: 5 mg via ORAL
  Administered 2021-03-06 – 2021-03-07 (×3): 10 mg via ORAL
  Filled 2021-03-04 (×2): qty 1
  Filled 2021-03-04 (×4): qty 2

## 2021-03-04 MED ORDER — ALUM & MAG HYDROXIDE-SIMETH 200-200-20 MG/5ML PO SUSP: 30.0000 mL | ORAL | Status: DC | PRN

## 2021-03-04 MED ORDER — ORAL CARE MOUTH RINSE: 15.0000 mL | Freq: Once | OROMUCOSAL | Status: AC

## 2021-03-04 MED ORDER — ROCURONIUM BROMIDE 10 MG/ML (PF) SYRINGE
PREFILLED_SYRINGE | INTRAVENOUS | Status: AC
  Filled 2021-03-04: qty 10

## 2021-03-04 MED ORDER — POVIDONE-IODINE 10 % EX SWAB
2.0000 "application " | Freq: Once | CUTANEOUS | Status: AC
  Administered 2021-03-04: 2 via TOPICAL

## 2021-03-04 MED ORDER — EPHEDRINE 5 MG/ML INJ
INTRAVENOUS | Status: AC
  Filled 2021-03-04: qty 5

## 2021-03-04 MED ORDER — OXYCODONE HCL 5 MG PO TABS
10.0000 mg | ORAL_TABLET | ORAL | Status: DC | PRN
  Administered 2021-03-06: 10 mg via ORAL
  Filled 2021-03-04: qty 2

## 2021-03-04 MED ORDER — ALBUMIN HUMAN 5 % IV SOLN
INTRAVENOUS | Status: AC
  Filled 2021-03-04: qty 250

## 2021-03-04 MED ORDER — METHOCARBAMOL 500 MG PO TABS
500.0000 mg | ORAL_TABLET | Freq: Four times a day (QID) | ORAL | Status: DC | PRN
  Administered 2021-03-04 – 2021-03-07 (×4): 500 mg via ORAL
  Filled 2021-03-04 (×3): qty 1

## 2021-03-04 MED ORDER — ONDANSETRON HCL 4 MG/2ML IJ SOLN
INTRAMUSCULAR | Status: DC | PRN
  Administered 2021-03-04: 4 mg via INTRAVENOUS

## 2021-03-04 MED ORDER — CELECOXIB 200 MG PO CAPS
200.0000 mg | ORAL_CAPSULE | Freq: Once | ORAL | Status: AC
  Administered 2021-03-04: 200 mg via ORAL

## 2021-03-04 MED ORDER — CHLORHEXIDINE GLUCONATE 0.12 % MT SOLN
15.0000 mL | Freq: Once | OROMUCOSAL | Status: AC
  Administered 2021-03-04: 15 mL via OROMUCOSAL

## 2021-03-04 MED ORDER — FLUOXETINE HCL 20 MG PO CAPS
20.0000 mg | ORAL_CAPSULE | Freq: Every day | ORAL | Status: DC
  Administered 2021-03-05 – 2021-03-07 (×3): 20 mg via ORAL
  Filled 2021-03-04 (×3): qty 1

## 2021-03-04 MED ORDER — METOCLOPRAMIDE HCL 5 MG/ML IJ SOLN: 5.0000 mg | Freq: Three times a day (TID) | INTRAMUSCULAR | Status: DC | PRN

## 2021-03-04 MED ORDER — INSULIN ASPART 100 UNIT/ML IJ SOLN: 0.0000 [IU] | Freq: Every day | INTRAMUSCULAR | Status: DC

## 2021-03-04 MED ORDER — TRANEXAMIC ACID-NACL 1000-0.7 MG/100ML-% IV SOLN
INTRAVENOUS | Status: AC
  Filled 2021-03-04: qty 100

## 2021-03-04 MED ORDER — FENTANYL CITRATE (PF) 100 MCG/2ML IJ SOLN: 25.0000 ug | INTRAMUSCULAR | Status: DC | PRN

## 2021-03-04 MED ORDER — ASPIRIN 81 MG PO CHEW
81.0000 mg | CHEWABLE_TABLET | Freq: Two times a day (BID) | ORAL | Status: DC
  Administered 2021-03-04 – 2021-03-07 (×6): 81 mg via ORAL
  Filled 2021-03-04 (×5): qty 1

## 2021-03-04 MED ORDER — SODIUM CHLORIDE 0.45 % IV SOLN: INTRAVENOUS | Status: AC

## 2021-03-04 MED ORDER — LIDOCAINE 2% (20 MG/ML) 5 ML SYRINGE
INTRAMUSCULAR | Status: AC
  Filled 2021-03-04: qty 5

## 2021-03-04 MED ORDER — ACETAMINOPHEN 500 MG PO TABS
1000.0000 mg | ORAL_TABLET | Freq: Once | ORAL | Status: AC
  Administered 2021-03-04: 1000 mg via ORAL

## 2021-03-04 MED ORDER — MENTHOL 3 MG MT LOZG: 1.0000 | LOZENGE | OROMUCOSAL | Status: DC | PRN

## 2021-03-04 MED ORDER — METHOCARBAMOL 500 MG PO TABS
ORAL_TABLET | ORAL | Status: AC
  Filled 2021-03-04: qty 1

## 2021-03-04 MED ORDER — CELECOXIB 200 MG PO CAPS
ORAL_CAPSULE | ORAL | Status: AC
  Filled 2021-03-04: qty 1

## 2021-03-04 MED ORDER — ASPIRIN 81 MG PO CHEW
CHEWABLE_TABLET | ORAL | Status: AC
  Filled 2021-03-04: qty 1

## 2021-03-04 MED ORDER — CEFAZOLIN SODIUM-DEXTROSE 1-4 GM/50ML-% IV SOLN
1.0000 g | Freq: Four times a day (QID) | INTRAVENOUS | Status: AC
  Administered 2021-03-04 – 2021-03-05 (×2): 1 g via INTRAVENOUS
  Filled 2021-03-04: qty 50

## 2021-03-04 MED ORDER — DEXAMETHASONE SODIUM PHOSPHATE 10 MG/ML IJ SOLN
INTRAMUSCULAR | Status: AC
  Filled 2021-03-04: qty 1

## 2021-03-04 MED ORDER — CEFAZOLIN SODIUM-DEXTROSE 2-4 GM/100ML-% IV SOLN
2.0000 g | INTRAVENOUS | Status: AC
  Administered 2021-03-04: 2 g via INTRAVENOUS

## 2021-03-04 MED ORDER — ACETAMINOPHEN 500 MG PO TABS
ORAL_TABLET | ORAL | Status: AC
  Filled 2021-03-04: qty 2

## 2021-03-04 MED ORDER — ALBUMIN HUMAN 5 % IV SOLN
12.5000 g | Freq: Once | INTRAVENOUS | Status: AC
  Administered 2021-03-04: 12.5 g via INTRAVENOUS

## 2021-03-04 MED ORDER — DOCUSATE SODIUM 100 MG PO CAPS
100.0000 mg | ORAL_CAPSULE | Freq: Two times a day (BID) | ORAL | Status: DC
  Administered 2021-03-04 – 2021-03-07 (×5): 100 mg via ORAL
  Filled 2021-03-04 (×5): qty 1

## 2021-03-04 MED ORDER — AMISULPRIDE (ANTIEMETIC) 5 MG/2ML IV SOLN: 10.0000 mg | Freq: Once | INTRAVENOUS | Status: DC | PRN

## 2021-03-04 MED ORDER — PANTOPRAZOLE SODIUM 40 MG PO TBEC
40.0000 mg | DELAYED_RELEASE_TABLET | Freq: Two times a day (BID) | ORAL | Status: DC
  Administered 2021-03-04 – 2021-03-07 (×7): 40 mg via ORAL
  Filled 2021-03-04 (×6): qty 1

## 2021-03-04 MED ORDER — PROPRANOLOL HCL 10 MG PO TABS
10.0000 mg | ORAL_TABLET | Freq: Two times a day (BID) | ORAL | Status: DC
  Administered 2021-03-04 – 2021-03-07 (×7): 10 mg via ORAL
  Filled 2021-03-04 (×8): qty 1

## 2021-03-04 MED ORDER — PANTOPRAZOLE SODIUM 40 MG PO TBEC
DELAYED_RELEASE_TABLET | ORAL | Status: AC
  Filled 2021-03-04: qty 1

## 2021-03-04 MED ORDER — CHLORHEXIDINE GLUCONATE 0.12 % MT SOLN
OROMUCOSAL | Status: AC
  Filled 2021-03-04: qty 15

## 2021-03-04 MED ORDER — ACETAMINOPHEN 325 MG PO TABS
325.0000 mg | ORAL_TABLET | Freq: Four times a day (QID) | ORAL | Status: DC | PRN
  Administered 2021-03-06: 650 mg via ORAL
  Filled 2021-03-04: qty 2

## 2021-03-04 MED ORDER — CEFAZOLIN SODIUM-DEXTROSE 1-4 GM/50ML-% IV SOLN
INTRAVENOUS | Status: AC
  Filled 2021-03-04: qty 50

## 2021-03-04 MED ORDER — CHLORHEXIDINE GLUCONATE 4 % EX LIQD: 60.0000 mL | Freq: Once | CUTANEOUS | Status: DC

## 2021-03-04 MED ORDER — PANTOPRAZOLE SODIUM 40 MG PO TBEC: 40.0000 mg | DELAYED_RELEASE_TABLET | Freq: Every day | ORAL | Status: DC

## 2021-03-04 MED ORDER — ACETAMINOPHEN 650 MG RE SUPP: 650.0000 mg | Freq: Four times a day (QID) | RECTAL | Status: DC | PRN

## 2021-03-04 MED ORDER — INSULIN ASPART 100 UNIT/ML IJ SOLN
0.0000 [IU] | Freq: Three times a day (TID) | INTRAMUSCULAR | Status: DC
  Administered 2021-03-06: 3 [IU] via SUBCUTANEOUS
  Administered 2021-03-07: 2 [IU] via SUBCUTANEOUS

## 2021-03-04 MED ORDER — PHENOL 1.4 % MT LIQD: 1.0000 | OROMUCOSAL | Status: DC | PRN

## 2021-03-04 MED ORDER — EZETIMIBE 10 MG PO TABS
10.0000 mg | ORAL_TABLET | Freq: Every day | ORAL | Status: DC
  Administered 2021-03-05 – 2021-03-07 (×3): 10 mg via ORAL
  Filled 2021-03-04 (×3): qty 1

## 2021-03-04 MED ORDER — ACETAMINOPHEN 325 MG PO TABS: 650.0000 mg | ORAL_TABLET | Freq: Four times a day (QID) | ORAL | Status: DC | PRN

## 2021-03-04 MED ORDER — HYDROMORPHONE HCL 1 MG/ML IJ SOLN: 1.0000 mg | INTRAMUSCULAR | Status: DC | PRN

## 2021-03-04 MED ORDER — 0.9 % SODIUM CHLORIDE (POUR BTL) OPTIME
TOPICAL | Status: DC | PRN
  Administered 2021-03-04: 1000 mL

## 2021-03-04 MED ORDER — CEFAZOLIN SODIUM-DEXTROSE 2-4 GM/100ML-% IV SOLN
INTRAVENOUS | Status: AC
  Filled 2021-03-04: qty 100

## 2021-03-04 MED ORDER — METHOCARBAMOL 1000 MG/10ML IJ SOLN
500.0000 mg | Freq: Four times a day (QID) | INTRAVENOUS | Status: DC | PRN
  Filled 2021-03-04: qty 5

## 2021-03-04 MED ORDER — SODIUM CHLORIDE 0.9 % IV SOLN: INTRAVENOUS | Status: DC

## 2021-03-04 MED ORDER — TRANEXAMIC ACID-NACL 1000-0.7 MG/100ML-% IV SOLN
1000.0000 mg | INTRAVENOUS | Status: AC
  Administered 2021-03-04: 1000 mg via INTRAVENOUS

## 2021-03-04 MED ORDER — ONDANSETRON HCL 4 MG/2ML IJ SOLN
INTRAMUSCULAR | Status: AC
  Filled 2021-03-04: qty 2

## 2021-03-04 MED ORDER — TRAZODONE HCL 50 MG PO TABS
50.0000 mg | ORAL_TABLET | Freq: Every day | ORAL | Status: DC
  Administered 2021-03-05 – 2021-03-06 (×2): 50 mg via ORAL
  Filled 2021-03-04 (×2): qty 1

## 2021-03-04 MED ORDER — BUPIVACAINE IN DEXTROSE 0.75-8.25 % IT SOLN
INTRATHECAL | Status: DC | PRN
  Administered 2021-03-04: 1.8 mL via INTRATHECAL

## 2021-03-04 MED ORDER — DIPHENHYDRAMINE HCL 12.5 MG/5ML PO ELIX: 12.5000 mg | ORAL_SOLUTION | ORAL | Status: DC | PRN

## 2021-03-04 MED ORDER — PROCHLORPERAZINE EDISYLATE 10 MG/2ML IJ SOLN
INTRAMUSCULAR | Status: AC
  Filled 2021-03-04: qty 2

## 2021-03-04 MED ORDER — OXYCODONE HCL ER 10 MG PO T12A
EXTENDED_RELEASE_TABLET | ORAL | Status: AC
  Administered 2021-03-04: 10 mg
  Filled 2021-03-04: qty 1

## 2021-03-04 MED ORDER — METOCLOPRAMIDE HCL 5 MG PO TABS: 5.0000 mg | ORAL_TABLET | Freq: Three times a day (TID) | ORAL | Status: DC | PRN

## 2021-03-04 MED ORDER — PHENYLEPHRINE HCL-NACL 20-0.9 MG/250ML-% IV SOLN
INTRAVENOUS | Status: DC | PRN
  Administered 2021-03-04: 30 ug/min via INTRAVENOUS

## 2021-03-04 MED ORDER — HYDROMORPHONE HCL 1 MG/ML IJ SOLN: 0.5000 mg | INTRAMUSCULAR | Status: DC | PRN

## 2021-03-04 MED ORDER — PHENYLEPHRINE HCL-NACL 20-0.9 MG/250ML-% IV SOLN
INTRAVENOUS | Status: AC
  Filled 2021-03-04: qty 500

## 2021-03-04 SURGICAL SUPPLY — 55 items
APL SKNCLS STERI-STRIP NONHPOA (GAUZE/BANDAGES/DRESSINGS) ×1
ARTICULEZE HEAD (Hips) ×2 IMPLANT
BAG COUNTER SPONGE SURGICOUNT (BAG) ×2 IMPLANT
BAG SPNG CNTER NS LX DISP (BAG) ×1
BENZOIN TINCTURE PRP APPL 2/3 (GAUZE/BANDAGES/DRESSINGS) ×2 IMPLANT
BLADE SAW SGTL 18X1.27X75 (BLADE) ×2 IMPLANT
COVER SURGICAL LIGHT HANDLE (MISCELLANEOUS) ×2 IMPLANT
DRAPE C-ARM 42X72 X-RAY (DRAPES) ×2 IMPLANT
DRAPE STERI IOBAN 125X83 (DRAPES) ×2 IMPLANT
DRAPE U-SHAPE 47X51 STRL (DRAPES) ×6 IMPLANT
DRESSING AQUACEL AG SP 3.5X10 (GAUZE/BANDAGES/DRESSINGS) IMPLANT
DRSG AQUACEL AG ADV 3.5X10 (GAUZE/BANDAGES/DRESSINGS) ×2 IMPLANT
DRSG AQUACEL AG SP 3.5X10 (GAUZE/BANDAGES/DRESSINGS) ×2
DURAPREP 26ML APPLICATOR (WOUND CARE) ×2 IMPLANT
ELECT BLADE 4.0 EZ CLEAN MEGAD (MISCELLANEOUS) ×2
ELECT BLADE 6.5 EXT (BLADE) IMPLANT
ELECT REM PT RETURN 9FT ADLT (ELECTROSURGICAL) ×2
ELECTRODE BLDE 4.0 EZ CLN MEGD (MISCELLANEOUS) ×1 IMPLANT
ELECTRODE REM PT RTRN 9FT ADLT (ELECTROSURGICAL) ×1 IMPLANT
FACESHIELD WRAPAROUND (MASK) ×6 IMPLANT
FACESHIELD WRAPAROUND OR TEAM (MASK) ×2 IMPLANT
GLOVE SRG 8 PF TXTR STRL LF DI (GLOVE) ×2 IMPLANT
GLOVE SURG LTX SZ8 (GLOVE) ×2 IMPLANT
GLOVE SURG ORTHO LTX SZ7.5 (GLOVE) ×4 IMPLANT
GLOVE SURG UNDER POLY LF SZ8 (GLOVE) ×4
GOWN STRL REUS W/ TWL LRG LVL3 (GOWN DISPOSABLE) ×2 IMPLANT
GOWN STRL REUS W/ TWL XL LVL3 (GOWN DISPOSABLE) ×2 IMPLANT
GOWN STRL REUS W/TWL LRG LVL3 (GOWN DISPOSABLE) ×4
GOWN STRL REUS W/TWL XL LVL3 (GOWN DISPOSABLE) ×4
HANDPIECE INTERPULSE COAX TIP (DISPOSABLE) ×2
HEAD ARTICULEZE (Hips) IMPLANT
KIT BASIN OR (CUSTOM PROCEDURE TRAY) ×2 IMPLANT
KIT TURNOVER KIT B (KITS) ×2 IMPLANT
LINER NEUTRAL 52X36MM PLUS 4 (Liner) ×1 IMPLANT
MANIFOLD NEPTUNE II (INSTRUMENTS) ×2 IMPLANT
NS IRRIG 1000ML POUR BTL (IV SOLUTION) ×2 IMPLANT
PACK TOTAL JOINT (CUSTOM PROCEDURE TRAY) ×2 IMPLANT
PAD ARMBOARD 7.5X6 YLW CONV (MISCELLANEOUS) ×2 IMPLANT
PIN SECTOR W/GRIP ACE CUP 52MM (Hips) ×1 IMPLANT
SET HNDPC FAN SPRY TIP SCT (DISPOSABLE) ×1 IMPLANT
STAPLER VISISTAT 35W (STAPLE) ×1 IMPLANT
STEM FEMORAL SZ5 HIGH ACTIS (Stem) ×1 IMPLANT
SUT ETHIBOND NAB CT1 #1 30IN (SUTURE) ×2 IMPLANT
SUT MNCRL AB 4-0 PS2 18 (SUTURE) IMPLANT
SUT VIC AB 0 CT1 27 (SUTURE) ×2
SUT VIC AB 0 CT1 27XBRD ANBCTR (SUTURE) ×1 IMPLANT
SUT VIC AB 1 CT1 27 (SUTURE) ×2
SUT VIC AB 1 CT1 27XBRD ANBCTR (SUTURE) ×1 IMPLANT
SUT VIC AB 2-0 CT1 27 (SUTURE) ×2
SUT VIC AB 2-0 CT1 TAPERPNT 27 (SUTURE) ×1 IMPLANT
TOWEL GREEN STERILE (TOWEL DISPOSABLE) ×2 IMPLANT
TOWEL GREEN STERILE FF (TOWEL DISPOSABLE) ×2 IMPLANT
TRAY FOLEY W/BAG SLVR 16FR (SET/KITS/TRAYS/PACK)
TRAY FOLEY W/BAG SLVR 16FR ST (SET/KITS/TRAYS/PACK) IMPLANT
WATER STERILE IRR 1000ML POUR (IV SOLUTION) ×4 IMPLANT

## 2021-03-04 NOTE — TOC CAGE-AID Note (Signed)
Transition of Care [facility]) - CAGE-AID Screening   Patient Details  Name: Alexis Cox MRN: [id number] Date of Birth: 01-08-1946  [facility] CM/SW Contact:    Lossie Faes Tarpley-Carter, LCSWA Phone Number: 03/04/2021, 9:57 AM   Clinical Narrative: Pt participated in Cage-Aid.  Pt stated she does not use substance or ETOH.  Pt was not offered resources, due to no usage of substance or ETOH.     Jakub Debold Tarpley-Carter, MSW, LCSW-A Pronouns:  She/Her/Hers [facility]itions of Care Clinical Social Worker Direct Number:  [phone] [email or web address]   CAGE-AID Screening:    Have You Ever Felt You Ought to Cut Down on Your Drinking or Drug Use?: No Have People Annoyed You By [employer] Your Drinking Or Drug Use?: No Have You Felt Bad Or Guilty About Your Drinking Or Drug Use?: No Have You Ever Had a Drink or Used Drugs First Thing In The Morning to Steady Your Nerves or to Get Rid of a Hangover?: No CAGE-AID Score: 0  Substance Abuse Education Offered: No

## 2021-03-04 NOTE — Plan of Care (Signed)

## 2021-03-04 NOTE — ED Notes (Signed)
Hospitalist at bedside 

## 2021-03-04 NOTE — ED Notes (Signed)
Ortho PA at bedside.  

## 2021-03-04 NOTE — Anesthesia Procedure Notes (Signed)
Spinal  Patient location during procedure: OR Start time: 03/04/2021 12:43 PM End time: 03/04/2021 12:52 PM Reason for block: surgical anesthesia Staffing Performed: anesthesiologist  Anesthesiologist: Heather Roberts, MD Preanesthetic Checklist Completed: patient identified, IV checked, risks and benefits discussed, surgical consent, monitors and equipment checked, pre-op evaluation and timeout performed Spinal Block Patient position: right lateral decubitus Prep: DuraPrep Patient monitoring: cardiac monitor, continuous pulse ox and blood pressure Approach: midline Location: L2-3 Injection technique: single-shot Needle Needle type: Pencan  Needle gauge: 24 G Needle length: 9 cm Assessment Events: CSF return Additional Notes Functioning IV was confirmed and monitors were applied. Sterile prep and drape, including hand hygiene and sterile gloves were used. The patient was positioned and the spine was prepped. The skin was anesthetized with lidocaine.  Free flow of clear CSF was obtained prior to injecting local anesthetic into the CSF.  The spinal needle aspirated freely following injection.  The needle was carefully withdrawn.  The patient tolerated the procedure well.

## 2021-03-04 NOTE — Anesthesia Preprocedure Evaluation (Addendum)
Anesthesia Evaluation  Patient identified by MRN, date of birth, ID band Patient awake    Reviewed: Allergy & Precautions, NPO status , Patient's Chart, lab work & pertinent test results, reviewed documented beta blocker date and time   History of Anesthesia Complications Negative for: history of anesthetic complications  Airway Mallampati: I  TM Distance: >3 FB Neck ROM: Full    Dental  (+) Edentulous Upper, Edentulous Lower, Dental Advisory Given   Pulmonary neg pulmonary ROS,    Pulmonary exam normal        Cardiovascular hypertension, Pt. on home beta blockers Normal cardiovascular exam     Neuro/Psych negative neurological ROS     GI/Hepatic negative GI ROS, Neg liver ROS,   Endo/Other  negative endocrine ROS  Renal/GU negative Renal ROS     Musculoskeletal negative musculoskeletal ROS (+)   Abdominal   Peds  Hematology negative hematology ROS (+) anemia ,   Anesthesia Other Findings   Reproductive/Obstetrics                            Anesthesia Physical Anesthesia Plan  ASA: 2  Anesthesia Plan: Spinal   Post-op Pain Management: Celebrex PO (pre-op) and Tylenol PO (pre-op)   Induction:   PONV Risk Score and Plan: 2 and Ondansetron and Propofol infusion  Airway Management Planned: Natural Airway  Additional Equipment:   Intra-op Plan:   Post-operative Plan:   Informed Consent: I have reviewed the patients History and Physical, chart, labs and discussed the procedure including the risks, benefits and alternatives for the proposed anesthesia with the patient or authorized representative who has indicated his/her understanding and acceptance.     Dental advisory given  Plan Discussed with: Anesthesiologist, CRNA and Surgeon  Anesthesia Plan Comments:        Anesthesia Quick Evaluation

## 2021-03-04 NOTE — Brief Op Note (Signed)
03/03/2021 - 03/04/2021  1:58 PM  PATIENT:  Alexis Cox  75 y.o. female  PRE-OPERATIVE DIAGNOSIS:  right hip fracture  POST-OPERATIVE DIAGNOSIS:  right hip fracture  PROCEDURE:  Procedure(s): TOTAL HIP ARTHROPLASTY ANTERIOR APPROACH (Right)  SURGEON:  Surgeon(s) and Role:    Kathryne Hitch, MD - Primary  PHYSICIAN ASSISTANT: Rexene Edison, PA-C  ANESTHESIA:   spinal  EBL:  300 mL   COUNTS:  YES  DICTATION: .Other Dictation: Dictation Number [id number]  PLAN OF CARE: Admit to inpatient   PATIENT DISPOSITION:  PACU - hemodynamically stable.   Delay start of Pharmacological VTE agent (>24hrs) due to surgical blood loss or risk of bleeding: no

## 2021-03-04 NOTE — Progress Notes (Addendum)
[facility] PROGRESS NOTE   Alexis Cox NOM:[id number] DOB: 1945-09-05 DOA: 03/03/2021  PCP: Ailene Ravel, MD  Brief History/Interval Summary: 75 y.o. female with medical history significant for impaired glucose tolerance, diet controlled, fatty liver disease, anxiety disorder, who presents by EMS after a fall at home.  Mechanical fall while she was moving groceries from her car to her refrigerator.  Denies any loss of consciousness.  No chest pain shortness of breath.  Found to have right femoral neck fracture.     Consultants: Orthopedics  Procedures: Transthoracic echocardiogram will be ordered    Subjective/Interval History: Patient denies any chest pain shortness of breath.  Does complain of some nausea and then she proceeded to vomit.  Denies any abdominal pain.  Pain in the right hip area is 10 out of 10 in intensity.  Mentions that she has reasonably good functional ability at baseline.  Does not get any dyspnea or shortness of breath with walking up and down the stairs.  Denies any history of heart disease     Assessment/Plan:  Right femoral neck fracture Management per orthopedics.  Plan is for surgery today.  Keep her n.p.o.  IV fluids.  Pain control. Based on the [place] Perioperative Risk Index the patient's estimated risk probability for perioperative myocardial infarction or cardiac arrest is 0.19%. Patient's procedure is intermediate risk. Patient's functional capacity is high. Based on the AHA/ACC algorithms patient may proceed to surgery without further testing.  Continue propranolol.  Cardiomegaly/prolonged QT interval QT interval was read as being prolonged on EKG which is poor quality.  We will repeat EKG.   Cardiomegaly noted on chest x-ray the patient denies any history of heart disease.  EKG does not show any concerning findings though it will be repeated.  We will do an echocardiogram while she is in the hospital though this should not delay her  surgery.  Check TSH. Noted to be on propranolol prior to admission.  Patient does not know why she is on this medicine.  She thinks it could be for her liver disease or anxiety.  We will continue for now.  Hyperglycemia Patient with history of impaired glucose tolerance.  Glucose levels noted to be 200 today.  Will check HbA1c.  SSI.  Fatty liver disease Attributes this to statin medications.  Outpatient follow-up.  Normocytic anemia No old labs available.  No evidence of overt bleeding.  We will check anemia panel.  History of anxiety and depression Continue fluoxetine.  Hyperlipidemia Continue Zetia  DVT Prophylaxis: Definitive prophylaxis after surgery. Code Status: Full code Family Communication: Discussed with patient.  No family at bedside Disposition Plan: To be determined  Status is: Inpatient  Remains inpatient appropriate because: Right femoral neck fracture requiring surgery      Medications: Scheduled:  [START ON 03/05/2021] ezetimibe  10 mg Oral Daily   [START ON 03/05/2021] FLUoxetine  20 mg Oral Daily   pantoprazole  40 mg Oral BID   propranolol  10 mg Oral BID   [START ON 03/05/2021] traZODone  50 mg Oral QHS   Continuous:  sodium chloride     lactated ringers 100 mL/hr at 03/04/21 0257   JGG:[id number] **OR** acetaminophen, HYDROmorphone (DILAUDID) injection, prochlorperazine, senna-docusate  Antibiotics: Anti-infectives (From admission, onward)    None       Objective:  Vital Signs  Vitals:   03/04/21 0530 03/04/21 0545 03/04/21 0800 03/04/21 0810  BP: (!) 144/73  (!) 159/97 (!) 177/79  Pulse: 73 73 91  77  Resp:   18 17  Temp:      TempSrc:      SpO2: 96% 96% 94% 99%  Weight:      Height:       No intake or output data in the 24 hours ending 03/04/21 0834 Filed Weights   03/04/21 0205  Weight: 68 kg    General appearance: Awake alert.  In no distress Resp: Clear to auscultation bilaterally.  Normal effort Cardio: S1-S2 is  normal regular.  No S3-S4.  No rubs murmurs or bruit GI: Abdomen is soft.  Nontender nondistended.  Bowel sounds are present normal.  No masses organomegaly Extremities: No edema.  Right lower extremity is externally rotated Neurologic: Alert and oriented x3.  No focal neurological deficits.    Lab Results:  Data Reviewed: I have personally reviewed following labs and imaging studies  CBC: Recent Labs  Lab 03/03/21 2058 03/04/21 0258  WBC 6.7 6.8  NEUTROABS 5.5  --   HGB 10.7* 10.6*  HCT 35.4* 34.3*  MCV 93.2 91.5  PLT 111* 110*    Basic Metabolic Panel: Recent Labs  Lab 03/03/21 2058 03/04/21 0258  NA 138 138  K 4.3 3.9  CL 103 104  CO2 26 25  GLUCOSE 164* 210*  BUN 15 15  CREATININE 1.00 1.05*  CALCIUM 8.9 8.9    GFR: Estimated Creatinine Clearance: 41.9 mL/min (A) (by C-G formula based on SCr of 1.05 mg/dL (H)).   Coagulation Profile: Recent Labs  Lab 03/03/21 2058  INR 1.1     Recent Results (from the past 240 hour(s))  Resp Panel by RT-PCR (Flu A&B, Covid) Nasopharyngeal Swab     Status: None   Collection Time: 03/03/21  9:12 PM   Specimen: Nasopharyngeal Swab; Nasopharyngeal(NP) swabs in vial transport medium  Result Value Ref Range Status   SARS Coronavirus 2 by RT PCR NEGATIVE NEGATIVE Final    Comment: (NOTE) SARS-CoV-2 target nucleic acids are NOT DETECTED.  The SARS-CoV-2 RNA is generally detectable in upper respiratory specimens during the acute phase of infection. The lowest concentration of SARS-CoV-2 viral copies this assay can detect is 138 copies/mL. A negative result does not preclude SARS-Cov-2 infection and should not be used as the sole basis for treatment or other patient management decisions. A negative result may occur with  improper specimen collection/handling, submission of specimen other than nasopharyngeal swab, presence of viral mutation(s) within the areas targeted by this assay, and inadequate number of  viral copies(<138 copies/mL). A negative result must be combined with clinical observations, patient history, and epidemiological information. The expected result is Negative.  Fact Sheet for Patients:  BloggerCourse.com  Fact Sheet for Healthcare Providers:  SeriousBroker.it  This test is no t yet approved or cleared by the [place] FDA and  has been authorized for detection and/or diagnosis of SARS-CoV-2 by FDA under an Emergency Use Authorization (EUA). This EUA will remain  in effect (meaning this test can be used) for the duration of the COVID-19 declaration under Section 564(b)(1) of the Act, 21 U.S.C.section 360bbb-3(b)(1), unless the authorization is terminated  or revoked sooner.       Influenza A by PCR NEGATIVE NEGATIVE Final   Influenza B by PCR NEGATIVE NEGATIVE Final    Comment: (NOTE) The Xpert Xpress SARS-CoV-2/FLU/RSV plus assay is intended as an aid in the diagnosis of influenza from Nasopharyngeal swab specimens and should not be used as a sole basis for treatment. Nasal washings and aspirates are unacceptable  for Xpert Xpress SARS-CoV-2/FLU/RSV testing.  Fact Sheet for Patients: BloggerCourse.com  Fact Sheet for Healthcare Providers: SeriousBroker.it  This test is not yet approved or cleared by the [place] FDA and has been authorized for detection and/or diagnosis of SARS-CoV-2 by FDA under an Emergency Use Authorization (EUA). This EUA will remain in effect (meaning this test can be used) for the duration of the COVID-19 declaration under Section 564(b)(1) of the Act, 21 U.S.C. section 360bbb-3(b)(1), unless the authorization is terminated or revoked.  Performed at [facility], [address], [place], [place] [address]       Radiology Studies: DG Chest 1 View  Result Date: 03/03/2021 CLINICAL DATA:  Initial evaluation for  preoperative evaluation. EXAM: CHEST  1 VIEW COMPARISON:  None available. FINDINGS: Cardiomegaly. Mediastinal silhouette within normal limits. Aortic atherosclerosis. Lungs mildly hypoinflated. Chronic coarsening of the interstitial markings with diffuse pulmonary interstitial congestion. No overt pulmonary edema. No pleural effusion. No pneumothorax. No acute osseous finding. IMPRESSION: 1. Cardiomegaly with diffuse pulmonary interstitial congestion without overt pulmonary edema. 2.  Aortic Atherosclerosis (ICD10-I70.0). Electronically Signed   By: Rise Mu M.D.   On: 03/03/2021 20:10   DG Hip Unilat With Pelvis 2-3 Views Right  Result Date: 03/03/2021 CLINICAL DATA:  Fall with hip pain EXAM: DG HIP (WITH OR WITHOUT PELVIS) 2-3V RIGHT COMPARISON:  None. FINDINGS: SI joints are non widened. Pubic symphysis and rami appear intact. Acute right femoral neck fracture with mild displacement and angulation. IMPRESSION: Acute displaced and mildly angulated right femoral neck fracture Electronically Signed   By: Jasmine Pang M.D.   On: 03/03/2021 20:10       LOS: 1 day   Alexis Cox  [facility] Pager on [email or web address]  03/04/2021, 8:34 AM

## 2021-03-04 NOTE — ED Notes (Signed)
Pt reported to Dietary that she vomited in the floor.  Moderate amount of emesis noted.  Will ask Hospitalist to order nausea medication.   Emesis bag provided.

## 2021-03-04 NOTE — Op Note (Signed)
NAME: Alexis Cox, Alexis Cox MEDICAL RECORD NO: [id number] ACCOUNT NO: [id number] DATE OF BIRTH: 1945-08-23 FACILITY: [facility] LOCATION: [facility] PHYSICIAN: Vanita Panda. Magnus Ivan, MD  Operative Report   DATE OF PROCEDURE: 03/03/2021  PREOPERATIVE DIAGNOSIS:  Right hip displaced femoral neck fracture.  POSTOPERATIVE DIAGNOSIS:  Right hip displaced femoral neck fracture.  PROCEDURE:  Right total hip arthroplasty, direct anterior approach.  IMPLANTS:  DePuy sector Gription acetabular component size 52, size 36+4 neutral polyethylene liner, size 5 ACTIS femoral component with high offset, size 36+5 metal hip ball.  SURGEON:  Vanita Panda. Magnus Ivan, MD  ASSISTANT:  Rexene Edison, PA-C  ANESTHESIA:  Spinal.  ANTIBIOTICS:  2 g IV Ancef.  ESTIMATED BLOOD LOSS:  300 mL.  COMPLICATIONS:  None.  INDICATIONS:  The patient is a very pleasant 75 year old female who was taking groceries into her house last evening when she accidentally tripped and fell injuring her right hip.  She was brought to the [facility] Emergency Room and found to have a  displaced right hip femoral neck fracture.  The orthopedic surgeon consulted on-call requested that someone be available today hopefully for performing hip replacement given the patient's young age and high level of function and activity.  Since I was  operating today, I volunteered to take over her case and care given the fact that I do hip replacement surgery through an anterior approach and we do have availability in the OR for getting this case done during the day to expedite things for the patient  as well as the OR.  I did have a long and thorough discussion with the patient about the rationale behind proceeding with a total hip arthroplasty to treat a displaced hip femoral neck fracture in someone who is otherwise young, healthy, and active.   The risks and benefits of surgery were explained in detail.  She does understand the rationale behind proceeding  with surgery and gives informed consent.  DESCRIPTION OF PROCEDURE:  After informed consent was obtained, appropriate right hip was marked.  She was brought to the operating room and rolled on her side where spinal anesthesia was obtained.  She was then laid in supine position and traction boots  were placed on both her feet.  Next, she was placed supine on the Hana fracture table, the perineal post in place and both legs in line skeletal traction device and no traction applied.  Her right operative hip was prepped and draped with DuraPrep and  sterile drapes.  A timeout was called. She was identified as correct patient, correct right hip.  I then made an incision just inferior and posterior to the anterior iliac spine and carried this obliquely down the leg.  We dissected down to tensor fascia  lata muscle.  Tensor fascia was then divided longitudinally to proceed with direct anterior approach to the hip.  We identified and cauterized circumflex vessels and then identified the hip capsule, opened up the hip capsule in L-type format finding a  large hemarthrosis consistent with a femoral neck fracture and you could see a displaced femoral neck fracture.  We made a refreshing femoral neck cut with an oscillating saw just proximal to the lesser trochanter and distal to the fracture and completed  this with an osteotome.  We removed the femoral head in its entirety and found to have no cartilage irregularities at all.  I then placed a bent Hohmann in medial acetabular rim and removed remnants of acetabular labrum and other debris.  I then began  reaming under direct visualization from a size 43 reamer in a stepwise increments going up to size 51 reamer with all reamers placed under direct visualization, the last reamer was placed under direct fluoroscopy so we could obtain our depth of reaming,  our inclination and anteversion.  I then placed real DePuy sector Gription acetabular component size 52.  I went  with a 36+4 neutral polyethylene liner based on medialization of the acetabular component.  Attention was then turned to the femur.  With the  leg externally rotated to 120 degrees, extended, and adducted, we were able to place a Mueller retractor medially and a Hohmann retractor behind the greater trochanter.  We released lateral joint capsule and used a box cutting osteotome to enter the  femoral canal and a rongeur to lateralize, then began broaching using the ACTIS broaching system from DePuy going from a size 0 to a size 5.  With size 5 in place, we trialed a standard offset femoral neck and a 36+1.5 head ball and easily reduced this  in the pelvis.  Based on radiographic assessment and clinical assessment, we needed more offset and leg length.  We dislocated the hip, removed the trial components.  We then placed the real ACTIS femoral component size 5, but a high offset component,  went with a real 36+5 metal hip ball, reduced this in acetabulum.  We were pleased with leg length, offset, range of motion, and stability, assessed radiographically and mechanically.  We then irrigated the soft tissue with normal saline solution using  pulsatile lavage.  We were able to close the joint capsule with interrupted #1 Ethibond suture followed by #1 Vicryl to close the tensor fascia.  0 Vicryl was used to close deep tissue and 2-0 Vicryl was used to close subcutaneous tissue.  Skin was  closed with staples.  An Aquacel dressing was applied.  She was taken off the Hana table and taken to recovery room in stable condition with all final counts being correct.  No complications noted.   Of note, Rexene Edison, PA-C's assistance was needed throughout this entire case from beginning to end, and his assistance was necessary for facilitating all aspects of this case.   ROH D: 03/04/2021 1:56:27 pm T: 03/04/2021 3:00:00 pm  JOB: [id number]/ [id number]

## 2021-03-04 NOTE — ED Notes (Signed)
Breakfast order placed ?

## 2021-03-04 NOTE — Anesthesia Procedure Notes (Signed)
Procedure Name: MAC Date/Time: 03/04/2021 12:55 PM Performed by: Annamary Carolin, CRNA Pre-anesthesia Checklist: Patient identified Patient Re-evaluated:Patient Re-evaluated prior to induction Oxygen Delivery Method: Simple face mask Preoxygenation: Pre-oxygenation with 100% oxygen Induction Type: IV induction Dental Injury: Teeth and Oropharynx as per pre-operative assessment

## 2021-03-04 NOTE — Consult Note (Signed)
Reason for Consult:Right hip fx Referring Physician: Osvaldo Shipper Time called: 0730 Time at bedside: 0840   Alexis Cox is an 75 y.o. female.  HPI: Ravonda was bringing her groceries in from the car and tripped on the stairs and fell. She had immediate right hip pain and could not get up. She was brought to the ED where x-rays showed a right hip fx and orthopedic surgery was consulted. She lives at home with her husband and does not use any assistive devices to ambulate.  History reviewed. No pertinent past medical history.  History reviewed. No pertinent surgical history.  History reviewed. No pertinent family history.  Social History:  reports that she has never smoked. She has never used smokeless tobacco. She reports that she does not drink alcohol. No history on file for drug use.  Allergies: No Known Allergies  Medications: I have reviewed the patient's current medications.  Results for orders placed or performed during the hospital encounter of 03/03/21 (from the past 48 hour(s))  Type and screen [facility]     Status: None   Collection Time: 03/03/21  8:55 PM  Result Value Ref Range   ABO/RH(D) O POS    Antibody Screen NEG    Sample Expiration      03/06/2021,2359 Performed at [facility], [address], [place], [place] [address]   Basic metabolic panel     Status: Abnormal   Collection Time: 03/03/21  8:58 PM  Result Value Ref Range   Sodium 138 135 - 145 mmol/L   Potassium 4.3 3.5 - 5.1 mmol/L   Chloride 103 98 - 111 mmol/L   CO2 26 22 - 32 mmol/L   Glucose, Bld 164 (H) 70 - 99 mg/dL    Comment: Glucose reference range applies only to samples taken after fasting for at least 8 hours.   BUN 15 8 - 23 mg/dL   Creatinine, Ser 2.23 0.44 - 1.00 mg/dL   Calcium 8.9 8.9 - 36.1 mg/dL   GFR, Estimated 59 (L) >60 mL/min    Comment: (NOTE) Calculated using the CKD-EPI Creatinine Equation (2021)    Anion gap 9 5 - 15    Comment:  Performed at [facility], [address] [place], [place] [address]  CBC WITH DIFFERENTIAL     Status: Abnormal   Collection Time: 03/03/21  8:58 PM  Result Value Ref Range   WBC 6.7 4.0 - 10.5 K/uL   RBC 3.80 (L) 3.87 - 5.11 MIL/uL   Hemoglobin 10.7 (L) 12.0 - 15.0 g/dL   HCT 75.3 (L) 00.5 - 11.0 %   MCV 93.2 80.0 - 100.0 fL   MCH 28.2 26.0 - 34.0 pg   MCHC 30.2 30.0 - 36.0 g/dL   RDW 21.1 17.3 - 56.7 %   Platelets 111 (L) 150 - 400 K/uL    Comment: Immature Platelet Fraction may be clinically indicated, consider ordering this additional test [id number] REPEATED TO VERIFY PLATELET COUNT CONFIRMED BY SMEAR    nRBC 0.0 0.0 - 0.2 %   Neutrophils Relative % 82 %   Neutro Abs 5.5 1.7 - 7.7 K/uL   Lymphocytes Relative 10 %   Lymphs Abs 0.7 0.7 - 4.0 K/uL   Monocytes Relative 4 %   Monocytes Absolute 0.3 0.1 - 1.0 K/uL   Eosinophils Relative 3 %   Eosinophils Absolute 0.2 0.0 - 0.5 K/uL   Basophils Relative 0 %   Basophils Absolute 0.0 0.0 -  0.1 K/uL   WBC Morphology MORPHOLOGY UNREMARKABLE    RBC Morphology MORPHOLOGY UNREMARKABLE    Smear Review MORPHOLOGY UNREMARKABLE    Immature Granulocytes 1 %   Abs Immature Granulocytes 0.03 0.00 - 0.07 K/uL    Comment: Performed at [facility], [address] [place], [place] [address]  Protime-INR     Status: None   Collection Time: 03/03/21  8:58 PM  Result Value Ref Range   Prothrombin Time 14.6 11.4 - 15.2 seconds   INR 1.1 0.8 - 1.2    Comment: (NOTE) INR goal varies based on device and disease states. Performed at [facility], [address], [place], [place] [address]   ABO/Rh     Status: None   Collection Time: 03/03/21  9:05 PM  Result Value Ref Range   ABO/RH(D)      O POS Performed at [facility], [address] [place], [place] [address]   Resp Panel by RT-PCR (Flu A&B, Covid) Nasopharyngeal Swab     Status: None   Collection Time: 03/03/21  9:12 PM   Specimen: Nasopharyngeal Swab;  Nasopharyngeal(NP) swabs in vial transport medium  Result Value Ref Range   SARS Coronavirus 2 by RT PCR NEGATIVE NEGATIVE    Comment: (NOTE) SARS-CoV-2 target nucleic acids are NOT DETECTED.  The SARS-CoV-2 RNA is generally detectable in upper respiratory specimens during the acute phase of infection. The lowest concentration of SARS-CoV-2 viral copies this assay can detect is 138 copies/mL. A negative result does not preclude SARS-Cov-2 infection and should not be used as the sole basis for treatment or other patient management decisions. A negative result may occur with  improper specimen collection/handling, submission of specimen other than nasopharyngeal swab, presence of viral mutation(s) within the areas targeted by this assay, and inadequate number of viral copies(<138 copies/mL). A negative result must be combined with clinical observations, patient history, and epidemiological information. The expected result is Negative.  Fact Sheet for Patients:  BloggerCourse.com  Fact Sheet for Healthcare Providers:  SeriousBroker.it  This test is no t yet approved or cleared by the [place] FDA and  has been authorized for detection and/or diagnosis of SARS-CoV-2 by FDA under an Emergency Use Authorization (EUA). This EUA will remain  in effect (meaning this test can be used) for the duration of the COVID-19 declaration under Section 564(b)(1) of the Act, 21 U.S.C.section 360bbb-3(b)(1), unless the authorization is terminated  or revoked sooner.       Influenza A by PCR NEGATIVE NEGATIVE   Influenza B by PCR NEGATIVE NEGATIVE    Comment: (NOTE) The Xpert Xpress SARS-CoV-2/FLU/RSV plus assay is intended as an aid in the diagnosis of influenza from Nasopharyngeal swab specimens and should not be used as a sole basis for treatment. Nasal washings and aspirates are unacceptable for Xpert Xpress  SARS-CoV-2/FLU/RSV testing.  Fact Sheet for Patients: BloggerCourse.com  Fact Sheet for Healthcare Providers: SeriousBroker.it  This test is not yet approved or cleared by the [place] FDA and has been authorized for detection and/or diagnosis of SARS-CoV-2 by FDA under an Emergency Use Authorization (EUA). This EUA will remain in effect (meaning this test can be used) for the duration of the COVID-19 declaration under Section 564(b)(1) of the Act, 21 U.S.C. section 360bbb-3(b)(1), unless the authorization is terminated or revoked.  Performed at [facility], [address], [place], [place] [address]   Basic metabolic panel     Status: Abnormal  Collection Time: 03/04/21  2:58 AM  Result Value Ref Range   Sodium 138 135 - 145 mmol/L   Potassium 3.9 3.5 - 5.1 mmol/L   Chloride 104 98 - 111 mmol/L   CO2 25 22 - 32 mmol/L   Glucose, Bld 210 (H) 70 - 99 mg/dL    Comment: Glucose reference range applies only to samples taken after fasting for at least 8 hours.   BUN 15 8 - 23 mg/dL   Creatinine, Ser 2.44 (H) 0.44 - 1.00 mg/dL   Calcium 8.9 8.9 - 01.0 mg/dL   GFR, Estimated 55 (L) >60 mL/min    Comment: (NOTE) Calculated using the CKD-EPI Creatinine Equation (2021)    Anion gap 9 5 - 15    Comment: Performed at [facility], [address] [place], [place] [address]  CBC     Status: Abnormal   Collection Time: 03/04/21  2:58 AM  Result Value Ref Range   WBC 6.8 4.0 - 10.5 K/uL   RBC 3.75 (L) 3.87 - 5.11 MIL/uL   Hemoglobin 10.6 (L) 12.0 - 15.0 g/dL   HCT 66.4 (L) 40.3 - 47.4 %   MCV 91.5 80.0 - 100.0 fL   MCH 28.3 26.0 - 34.0 pg   MCHC 30.9 30.0 - 36.0 g/dL   RDW 25.9 56.3 - 87.5 %   Platelets 110 (L) 150 - 400 K/uL    Comment: Immature Platelet Fraction may be clinically indicated, consider ordering this additional test [id number] CONSISTENT WITH PREVIOUS RESULT REPEATED TO VERIFY    nRBC 0.0 0.0 -  0.2 %    Comment: Performed at [facility], [address], [place], [place] [address]    DG Chest 1 View  Result Date: 03/03/2021 CLINICAL DATA:  Initial evaluation for preoperative evaluation. EXAM: CHEST  1 VIEW COMPARISON:  None available. FINDINGS: Cardiomegaly. Mediastinal silhouette within normal limits. Aortic atherosclerosis. Lungs mildly hypoinflated. Chronic coarsening of the interstitial markings with diffuse pulmonary interstitial congestion. No overt pulmonary edema. No pleural effusion. No pneumothorax. No acute osseous finding. IMPRESSION: 1. Cardiomegaly with diffuse pulmonary interstitial congestion without overt pulmonary edema. 2.  Aortic Atherosclerosis (ICD10-I70.0). Electronically Signed   By: Rise Mu M.D.   On: 03/03/2021 20:10   DG Hip Unilat With Pelvis 2-3 Views Right  Result Date: 03/03/2021 CLINICAL DATA:  Fall with hip pain EXAM: DG HIP (WITH OR WITHOUT PELVIS) 2-3V RIGHT COMPARISON:  None. FINDINGS: SI joints are non widened. Pubic symphysis and rami appear intact. Acute right femoral neck fracture with mild displacement and angulation. IMPRESSION: Acute displaced and mildly angulated right femoral neck fracture Electronically Signed   By: Jasmine Pang M.D.   On: 03/03/2021 20:10    Review of Systems  HENT:  Negative for ear discharge, ear pain, hearing loss and tinnitus.   Eyes:  Negative for photophobia and pain.  Respiratory:  Negative for cough and shortness of breath.   Cardiovascular:  Negative for chest pain.  Gastrointestinal:  Negative for abdominal pain, nausea and vomiting.  Genitourinary:  Negative for dysuria, flank pain, frequency and urgency.  Musculoskeletal:  Positive for arthralgias (Right hip). Negative for back pain, myalgias and neck pain.  Neurological:  Negative for dizziness and headaches.  Hematological:  Does not bruise/bleed easily.  Psychiatric/Behavioral:  The patient is not nervous/anxious.   Blood pressure (!)  177/79, pulse 77, temperature 97.9 F (36.6 C), temperature source Oral, resp. rate 17, height  (1.575 m), weight 68 kg,  SpO2 99 %. Physical Exam Constitutional:      General: She is not in acute distress.    Appearance: She is well-developed. She is not diaphoretic.  HENT:     Head: Normocephalic and atraumatic.  Eyes:     General: No scleral icterus.       Right eye: No discharge.        Left eye: No discharge.     Conjunctiva/sclera: Conjunctivae normal.  Cardiovascular:     Rate and Rhythm: Normal rate and regular rhythm.  Pulmonary:     Effort: Pulmonary effort is normal. No respiratory distress.  Musculoskeletal:     Cervical back: Normal range of motion.     Comments: RLE No traumatic wounds, ecchymosis, or rash  Mod TTP hip, minimal TTP knee  No knee or ankle effusion  Knee stable to varus/ valgus and anterior/posterior stress  Sens DPN, SPN, TN intact  Motor EHL, ext, flex, evers 5/5  DP 2+, PT 2+, No significant edema  Skin:    General: Skin is warm and dry.  Neurological:     Mental Status: She is alert.  Psychiatric:        Mood and Affect: Mood normal.        Behavior: Behavior normal.    Assessment/Plan: Right hip fx -- Plan THA today by Dr. Magnus Ivan. Please keep NPO.    Freeman Caldron, PA-C Orthopedic Surgery [phone] 03/04/2021, 8:46 AM

## 2021-03-04 NOTE — Transfer of Care (Signed)
Immediate Anesthesia Transfer of Care Note  Patient: Alexis Cox  Procedure(s) Performed: TOTAL HIP ARTHROPLASTY ANTERIOR APPROACH (Right: Hip)  Patient Location: PACU  Anesthesia Type:MAC and Spinal  Level of Consciousness: awake, alert  and patient cooperative  Airway & Oxygen Therapy: Patient Spontanous Breathing and Patient connected to face mask oxygen  Post-op Assessment: Report given to RN, Post -op Vital signs reviewed and stable and Patient moving all extremities  Post vital signs: Reviewed and stable  Last Vitals:  Vitals Value Taken Time  BP 105/54 03/04/21 1415  Temp 36.7 C 03/04/21 1415  Pulse 82 03/04/21 1419  Resp 16 03/04/21 1419  SpO2 94 % 03/04/21 1419  Vitals shown include unvalidated device data.  Last Pain:  Vitals:   03/04/21 1415  TempSrc:   PainSc: Asleep         Complications: No notable events documented.

## 2021-03-05 ENCOUNTER — Inpatient Hospital Stay (HOSPITAL_COMMUNITY): Payer: Medicare Other

## 2021-03-05 ENCOUNTER — Encounter (HOSPITAL_COMMUNITY): Payer: Self-pay | Admitting: Orthopaedic Surgery

## 2021-03-05 DIAGNOSIS — I517 Cardiomegaly: Secondary | ICD-10-CM | POA: Diagnosis not present

## 2021-03-05 DIAGNOSIS — Z96641 Presence of right artificial hip joint: Secondary | ICD-10-CM

## 2021-03-05 DIAGNOSIS — W19XXXA Unspecified fall, initial encounter: Secondary | ICD-10-CM

## 2021-03-05 DIAGNOSIS — R7302 Impaired glucose tolerance (oral): Secondary | ICD-10-CM

## 2021-03-05 LAB — CBC
HCT: 28.2 % — ABNORMAL LOW (ref 36.0–46.0)
Hemoglobin: 8.7 g/dL — ABNORMAL LOW (ref 12.0–15.0)
MCH: 28.3 pg (ref 26.0–34.0)
MCHC: 30.9 g/dL (ref 30.0–36.0)
MCV: 91.9 fL (ref 80.0–100.0)
Platelets: 85 10*3/uL — ABNORMAL LOW (ref 150–400)
RBC: 3.07 MIL/uL — ABNORMAL LOW (ref 3.87–5.11)
RDW: 15.5 % (ref 11.5–15.5)
WBC: 7.2 10*3/uL (ref 4.0–10.5)
nRBC: 0 % (ref 0.0–0.2)

## 2021-03-05 LAB — BASIC METABOLIC PANEL
Anion gap: 5 (ref 5–15)
BUN: 10 mg/dL (ref 8–23)
CO2: 28 mmol/L (ref 22–32)
Calcium: 8.3 mg/dL — ABNORMAL LOW (ref 8.9–10.3)
Chloride: 104 mmol/L (ref 98–111)
Creatinine, Ser: 0.87 mg/dL (ref 0.44–1.00)
GFR, Estimated: >60 mL/min (ref >60)
Glucose, Bld: 147 mg/dL — ABNORMAL HIGH (ref 70–99)
Potassium: 3.6 mmol/L (ref 3.5–5.1)
Sodium: 137 mmol/L (ref 135–145)

## 2021-03-05 LAB — IRON AND TIBC
Iron: 19 ug/dL — ABNORMAL LOW (ref 28–170)
Saturation Ratios: 5 % — ABNORMAL LOW (ref 10.4–31.8)
TIBC: 350 ug/dL (ref 250–450)
UIBC: 331 ug/dL

## 2021-03-05 LAB — ECHOCARDIOGRAM COMPLETE
Area-P 1/2: 3.83 cm2
Height: 62 in
S' Lateral: 2.7 cm
Weight: 2400 oz

## 2021-03-05 LAB — TSH: TSH: 2.836 u[IU]/mL (ref 0.350–4.500)

## 2021-03-05 LAB — RETICULOCYTES
Immature Retic Fract: 26.9 % — ABNORMAL HIGH (ref 2.3–15.9)
RBC.: 3.04 MIL/uL — ABNORMAL LOW (ref 3.87–5.11)
Retic Count, Absolute: 79.6 10*3/uL (ref 19.0–186.0)
Retic Ct Pct: 2.6 % (ref 0.4–3.1)

## 2021-03-05 LAB — FOLATE: Folate: 21.9 ng/mL (ref >5.9)

## 2021-03-05 LAB — FERRITIN: Ferritin: 53 ng/mL (ref 11–307)

## 2021-03-05 LAB — VITAMIN B12: Vitamin B-12: 160 pg/mL — ABNORMAL LOW (ref 180–914)

## 2021-03-05 LAB — GLUCOSE, CAPILLARY
Glucose-Capillary: 130 mg/dL — ABNORMAL HIGH (ref 70–99)
Glucose-Capillary: 137 mg/dL — ABNORMAL HIGH (ref 70–99)
Glucose-Capillary: 154 mg/dL — ABNORMAL HIGH (ref 70–99)
Glucose-Capillary: 116 mg/dL — ABNORMAL HIGH (ref 70–99)
Glucose-Capillary: 171 mg/dL — ABNORMAL HIGH (ref 70–99)

## 2021-03-05 MED ORDER — POLYETHYLENE GLYCOL 3350 17 G PO PACK
17.0000 g | PACK | Freq: Two times a day (BID) | ORAL | Status: AC
  Administered 2021-03-05 – 2021-03-06 (×3): 17 g via ORAL
  Filled 2021-03-05 (×3): qty 1

## 2021-03-05 NOTE — TOC Initial Note (Signed)
Transition of Care [facility]) - Initial/Assessment Note    Patient Details  Name: Alexis Cox MRN: [id number] Date of Birth: [birth year]  Transition of Care [facility]) CM/SW Contact:    Joanne Chars, LCSW Phone Number: 03/05/2021, 4:08 PM  Clinical Narrative: CSW met with pt regarding DC recommendation for SNF.  Pt agreeable, choice document given, pt requesting [facility].  Permission given to send out referral in hub.    Permission given to speak with husband Wynetta Emery and daughter Santiago Glad.  Pt reports she is vaccinated for covid with "all the boosters", unclear if she has had 2 or 3 boosters.    Referral sent out in hub.                  Expected Discharge Plan: Skilled Nursing Facility Barriers to Discharge: Continued Medical Work up, SNF Pending bed offer   Patient Goals and CMS Choice Patient states their goals for this hospitalization and ongoing recovery are:: "walking good" CMS Medicare.gov Compare Post Acute Care list provided to:: Patient Choice offered to / list presented to : Patient  Expected Discharge Plan and Services Expected Discharge Plan: [place] In-house Referral: Clinical Social Work   Post Acute Care Choice: [place] Living arrangements for the past 2 months: [place]                                      Prior Living Arrangements/Services Living arrangements for the past 2 months: Single Family Home Lives with:: Spouse Patient language and need for interpreter reviewed:: Yes Do you feel safe going back to the place where you live?: Yes      Need for Family Participation in Patient Care: Yes (Comment) Care giver support system in place?: Yes (comment) Current home services: Other (comment) (none) Criminal Activity/Legal Involvement Pertinent to Current Situation/Hospitalization: No - Comment as needed  Activities of Daily Living Home Assistive Devices/Equipment: Eyeglasses, Dentures (specify  type) (upper and lower) ADL Screening (condition at time of admission) Patient's cognitive ability adequate to safely complete daily activities?: Yes Is the patient deaf or have difficulty hearing?: No Does the patient have difficulty seeing, even when wearing glasses/contacts?: No Does the patient have difficulty concentrating, remembering, or making decisions?: No Patient able to express need for assistance with ADLs?: Yes Does the patient have difficulty dressing or bathing?: No Independently performs ADLs?: Yes (appropriate for developmental age) Does the patient have difficulty walking or climbing stairs?: No Weakness of Legs: Both Weakness of Arms/Hands: None  Permission Sought/Granted Permission sought to share information with : Family Supports, Chartered certified accountant granted to share information with : Yes, Verbal Permission Granted  Share Information with NAME: husband Wynetta Emery, daughter Santiago Glad  Permission granted to share info w AGENCY: SNF        Emotional Assessment Appearance:: Appears stated age Attitude/Demeanor/Rapport: Engaged Affect (typically observed): Appropriate, Pleasant Orientation: : Oriented to Self, Oriented to Place, Oriented to  Time, Oriented to Situation Alcohol / Substance Use: Not Applicable Psych Involvement: No (comment)  Admission diagnosis:  Fall, initial encounter [W19.XXXA] Closed displaced fracture of right femoral neck ([place]) [S72.001A] Patient Active Problem List   Diagnosis Date Noted   Closed displaced fracture of right femoral neck ([place]) 03/03/2021   Prolonged QT interval 03/03/2021   Impaired glucose tolerance 03/03/2021   PCP:  Leonides Sake, MD Pharmacy:   [facility] [phone] -  [place], [place] - [address] AT [place] [address] [facility] [address] Phone: [phone] Fax: [phone]     Social Determinants of Health (SDOH) Interventions    Readmission Risk  Interventions No flowsheet data found.

## 2021-03-05 NOTE — Progress Notes (Signed)
Pt and pt's family are adamant that pt not receive any insulin coverage while hospitalized. Rn was called to the room to discuss the fact that pt was put on a sliding scale and that she has never taken insulin at home. Per pt's daughter, pt is a pre-diabetic who manages her sugar levels with her diet. Pt and daughter were assured that staff would not be administering any meds declined. Pt's daughter left reassured.

## 2021-03-05 NOTE — NC FL2 (Signed)
[place] MEDICAID FL2 LEVEL OF CARE SCREENING TOOL     IDENTIFICATION  Patient Name: Alexis Cox Birthdate: [birth year] Sex: female Admission Date (Current Location): 03/03/2021  [facility] and [place] Number:  Producer, television/film/video and Address:  [facility], [address] [place], [place] [address]      Provider Number: [id number]  Attending Physician Name and Address:  Marinda Elk, MD  Relative Name and Phone Number:  Fiorini,Lester Spouse [phone]    Current Level of Care: Hospital Recommended Level of Care: Skilled Nursing Facility Prior Approval Number:    Date Approved/Denied:   PASRR Number: [id number]  Discharge Plan: SNF    Current Diagnoses: Patient Active Problem List   Diagnosis Date Noted   Closed displaced fracture of right femoral neck (HCC) 03/03/2021   Prolonged QT interval 03/03/2021   Impaired glucose tolerance 03/03/2021    Orientation RESPIRATION BLADDER Height & Weight     Self, Time, Situation, Place  O2 Continent Weight: 150 lb (68 kg) Height:  5\' 2"  (157.5 cm)  BEHAVIORAL SYMPTOMS/MOOD NEUROLOGICAL BOWEL NUTRITION STATUS      Continent Diet (see discharge summary)  AMBULATORY STATUS COMMUNICATION OF NEEDS Skin   Limited Assist Verbally Surgical wounds                       Personal Care Assistance Level of Assistance  Bathing, Feeding, Dressing Bathing Assistance: Limited assistance Feeding assistance: Independent Dressing Assistance: Limited assistance     Functional Limitations Info  Sight, Hearing, Speech Sight Info: Adequate Hearing Info: Adequate Speech Info: Adequate    SPECIAL CARE FACTORS FREQUENCY  PT (By licensed PT), OT (By licensed OT)     PT Frequency: 5x week OT Frequency: 5x week            Contractures Contractures Info: Not present    Additional Factors Info  Code Status, Allergies, Insulin Sliding Scale Code Status Info: full Allergies Info: NKA    Insulin Sliding Scale Info: Novolog, 0-15 units 3x day with meals, 0-5 units bedtime.  See discharge summary       Current Medications (03/05/2021):  This is the current hospital active medication list Current Facility-Administered Medications  Medication Dose Route Frequency Provider Last Rate Last Admin   0.9 %  sodium chloride infusion   Intravenous Continuous 03/07/2021, MD 75 mL/hr at 03/05/21 0856 New Bag at 03/05/21 0856   acetaminophen (TYLENOL) tablet 325-650 mg  325-650 mg Oral Q6H PRN 03/07/21, MD       alum & mag hydroxide-simeth (MAALOX/MYLANTA) 200-200-20 MG/5ML suspension 30 mL  30 mL Oral Q4H PRN 10-13-2000, MD       aspirin chewable tablet 81 mg  81 mg Oral BID Kathryne Hitch, MD   81 mg at 03/05/21 0857   diphenhydrAMINE (BENADRYL) 12.5 MG/5ML elixir 12.5-25 mg  12.5-25 mg Oral Q4H PRN 05-06-1984, MD       docusate sodium (COLACE) capsule 100 mg  100 mg Oral BID Kathryne Hitch, MD   100 mg at 03/05/21 0857   ezetimibe (ZETIA) tablet 10 mg  10 mg Oral Daily 03/07/21, MD   10 mg at 03/05/21 0856   FLUoxetine (PROZAC) capsule 20 mg  20 mg Oral Daily 03/07/21, MD   20 mg at 03/05/21 0858   HYDROmorphone (DILAUDID) injection 0.5-1 mg  0.5-1 mg Intravenous Q4H PRN 03/07/21, MD  insulin aspart (novoLOG) injection 0-15 Units  0-15 Units Subcutaneous TID WC Kathryne Hitch, MD       insulin aspart (novoLOG) injection 0-5 Units  0-5 Units Subcutaneous QHS Kathryne Hitch, MD       menthol-cetylpyridinium (CEPACOL) lozenge 3 mg  1 lozenge Oral PRN Kathryne Hitch, MD       Or   phenol (CHLORASEPTIC) mouth spray 1 spray  1 spray Mouth/Throat PRN Kathryne Hitch, MD       methocarbamol (ROBAXIN) tablet 500 mg  500 mg Oral Q6H PRN Kathryne Hitch, MD   500 mg at 03/05/21 0177   Or   methocarbamol (ROBAXIN) 500 mg in dextrose 5 %  50 mL IVPB  500 mg Intravenous Q6H PRN Kathryne Hitch, MD       metoCLOPramide (REGLAN) tablet 5-10 mg  5-10 mg Oral Q8H PRN Kathryne Hitch, MD       Or   metoCLOPramide (REGLAN) injection 5-10 mg  5-10 mg Intravenous Q8H PRN Kathryne Hitch, MD       oxyCODONE (Oxy IR/ROXICODONE) immediate release tablet 10-15 mg  10-15 mg Oral Q4H PRN Kathryne Hitch, MD       oxyCODONE (Oxy IR/ROXICODONE) immediate release tablet 5-10 mg  5-10 mg Oral Q4H PRN Kathryne Hitch, MD   5 mg at 03/05/21 0717   pantoprazole (PROTONIX) EC tablet 40 mg  40 mg Oral BID Kathryne Hitch, MD   40 mg at 03/05/21 0857   polyethylene glycol (MIRALAX / GLYCOLAX) packet 17 g  17 g Oral BID Marinda Elk, MD   17 g at 03/05/21 1255   prochlorperazine (COMPAZINE) injection 10 mg  10 mg Intravenous Q6H PRN Kathryne Hitch, MD   10 mg at 03/04/21 0959   propranolol (INDERAL) tablet 10 mg  10 mg Oral BID Kathryne Hitch, MD   10 mg at 03/05/21 0857   senna-docusate (Senokot-S) tablet 1 tablet  1 tablet Oral QHS PRN Kathryne Hitch, MD       traZODone (DESYREL) tablet 50 mg  50 mg Oral QHS Kathryne Hitch, MD         Discharge Medications: Please see discharge summary for a list of discharge medications.  Relevant Imaging Results:  Relevant Lab Results:   Additional Information SSN: [id number].  Pt reports she is vaccinated for covid with 2or 3 boosters.  Lorri Frederick, LCSW

## 2021-03-05 NOTE — Progress Notes (Signed)
Subjective: 1 Day Post-Op Procedure(s) (LRB): TOTAL HIP ARTHROPLASTY ANTERIOR APPROACH (Right) Patient reports pain as moderate.  She is sitting up at the bedside commode this morning.  She looks good overall.  She does have acute on chronic anemia.  Her vital signs are stable.  Objective: Vital signs in last 24 hours: Temp:  [97.5 F (36.4 C)-99.1 F (37.3 C)] 98.5 F (36.9 C) (11/30 0519) Pulse Rate:  [62-91] 77 (11/30 0519) Resp:  [14-18] 15 (11/30 0519) BP: (88-183)/(41-97) 131/56 (11/30 0519) SpO2:  [91 %-100 %] 93 % (11/30 0519)  Intake/Output from previous day: 11/29 0701 - 11/30 0700 In: 1496 [I.V.:1224; IV Piggyback:272] Out: 850 [Urine:550; Blood:300] Intake/Output this shift: No intake/output data recorded.  Recent Labs    03/03/21 2058 03/04/21 0258 03/05/21 0259  HGB 10.7* 10.6* 8.7*   Recent Labs    03/04/21 0258 03/05/21 0259  WBC 6.8 7.2  RBC 3.75* 3.07*  3.04*  HCT 34.3* 28.2*  PLT 110* 85*   Recent Labs    03/04/21 0258 03/05/21 0259  NA 138 137  K 3.9 3.6  CL 104 104  CO2 25 28  BUN 15 10  CREATININE 1.05* 0.87  GLUCOSE 210* 147*  CALCIUM 8.9 8.3*   Recent Labs    03/03/21 2058  INR 1.1    Sensation intact distally Intact pulses distally Dorsiflexion/Plantar flexion intact Incision: scant drainage   Assessment/Plan: 1 Day Post-Op Procedure(s) (LRB): TOTAL HIP ARTHROPLASTY ANTERIOR APPROACH (Right) Up with therapy Weightbearing as tolerated for the right hip. 81 mg aspirin twice daily for DVT coverage.     Kathryne Hitch 03/05/2021, 7:48 AM

## 2021-03-05 NOTE — Progress Notes (Signed)
Echocardiogram 2D Echocardiogram has been performed.  Warren Lacy Lucille Crichlow RDCS 03/05/2021, 11:08 AM

## 2021-03-05 NOTE — Plan of Care (Signed)

## 2021-03-05 NOTE — Progress Notes (Signed)
[facility] PROGRESS NOTE    Progress Note  SHALENE GALLEN  IEP:[id number] DOB: [birth year] DOA: 03/03/2021 PCP: Ailene Ravel, MD     Brief Narrative:   KITZIA CAMUS is an 75 y.o. female past medical history significant for impaired glucose intolerance, fatty liver presents to the ED after a mechanical fall by EMS, denies any loss of consciousness, chest pain or shortness of breath found to have a right femoral neck fracture    Assessment/Plan:   Closed displaced fracture of right femoral neck [facility]): Status post anterior approach right total hip arthroplasty 03/04/2021. Preop evaluation Chales Abrahams perioperative risk in this for cardiac arrest or myocardial infarction is less than 0.1% High functional capacity. Continue atenolol Orthopedic surgery recommended weightbearing as tolerated, 81 mg aspirin twice a day for DVT prophylaxis. Physical therapy and Occupational Therapy evaluation is pending.  Prolonged QTC/cardiomegaly: Repeated twelve-lead EKG showed new 480 Echocardiogram was sent results are pending. TSH 2.8 She does not know why she is on propanolol.  Hyperglycemia: With an A1c of 6.3, her blood glucose improved now she is tolerating her diet not being covered with insulin.  Fatty liver: Attributed to statin follow-up with PCP as an outpatient.  Normocytic anemia: No signs of overt bleeding.  History anxiety/depression: Continue fluoxetine.  Hyperlipidemia: continue Zetia   DVT prophylaxis: ASA BID Family Communication:family Status is: Inpatient  Remains inpatient appropriate because: Acute hip repair will need skilled nursing facility placement.      Code Status:     Code Status Orders  (From admission, onward)           Start     Ordered   03/04/21 0142  Full code  Continuous        03/04/21 0141           Code Status History     This patient has a current code status but no historical code status.         IV  Access:   Peripheral IV   Procedures and diagnostic studies:   DG Chest 1 View  Result Date: 03/03/2021 CLINICAL DATA:  Initial evaluation for preoperative evaluation. EXAM: CHEST  1 VIEW COMPARISON:  None available. FINDINGS: Cardiomegaly. Mediastinal silhouette within normal limits. Aortic atherosclerosis. Lungs mildly hypoinflated. Chronic coarsening of the interstitial markings with diffuse pulmonary interstitial congestion. No overt pulmonary edema. No pleural effusion. No pneumothorax. No acute osseous finding. IMPRESSION: 1. Cardiomegaly with diffuse pulmonary interstitial congestion without overt pulmonary edema. 2.  Aortic Atherosclerosis (ICD10-I70.0). Electronically Signed   By: Rise Mu M.D.   On: 03/03/2021 20:10   DG Pelvis Portable  Result Date: 03/04/2021 CLINICAL DATA:  Status post right hip replacement EXAM: PORTABLE PELVIS 1-2 VIEWS COMPARISON:  Pelvis radiographs dated 1 day prior, intraoperative hip radiographs obtained earlier the same day FINDINGS: Postsurgical changes reflecting right hip arthroplasty are seen. Hardware alignment is within expected limits, without evidence of hardware related complication. There is expected postoperative soft tissue gas around the right hip. Mild degenerative changes about the left hip are unchanged. The SI joints and symphysis pubis are intact. IMPRESSION: Status post right hip replacement without evidence of complication. Electronically Signed   By: Lesia Hausen M.D.   On: 03/04/2021 15:50   DG C-Arm 1-60 Min-No Report  Result Date: 03/04/2021 Fluoroscopy was utilized by the requesting physician.  No radiographic interpretation.   DG HIP OPERATIVE UNILAT WITH PELVIS RIGHT  Result Date: 03/04/2021 CLINICAL DATA:  Right hip replacement EXAM: OPERATIVE right  HIP (WITH PELVIS IF PERFORMED) 2 VIEWS TECHNIQUE: Fluoroscopic spot image(s) were submitted for interpretation post-operatively. COMPARISON:  Pelvis radiographs dated 1  day prior FINDINGS: There has been interval right hip arthroplasty. Hardware alignment is within expected limits, without evidence of hardware related complication. The pubic symphysis is intact. Fluoro time 17 seconds. IMPRESSION: Status post right hip arthroplasty without evidence of complication. Electronically Signed   By: Lesia Hausen M.D.   On: 03/04/2021 15:36   DG Hip Unilat With Pelvis 2-3 Views Right  Result Date: 03/03/2021 CLINICAL DATA:  Fall with hip pain EXAM: DG HIP (WITH OR WITHOUT PELVIS) 2-3V RIGHT COMPARISON:  None. FINDINGS: SI joints are non widened. Pubic symphysis and rami appear intact. Acute right femoral neck fracture with mild displacement and angulation. IMPRESSION: Acute displaced and mildly angulated right femoral neck fracture Electronically Signed   By: Jasmine Pang M.D.   On: 03/03/2021 20:10     Medical Consultants:   None.   Subjective:    LABRITTANY WECHTER she relates her pain is controlled.  Objective:    Vitals:   03/04/21 1733 03/04/21 1942 03/05/21 0519 03/05/21 0826  BP: (!) 108/45 (!) 104/45 (!) 131/56 (!) 131/53  Pulse: 66 62 77 74  Resp:  14 15 17   Temp: 98.2 F (36.8 C) (!) 97.5 F (36.4 C) 98.5 F (36.9 C) 98.1 F (36.7 C)  TempSrc:   Oral   SpO2: 97% 97% 93% 92%  Weight:      Height:       SpO2: 92 % O2 Flow Rate (L/min): 2 L/min   Intake/Output Summary (Last 24 hours) at 03/05/2021 0944 Last data filed at 03/04/2021 1800 Gross per 24 hour  Intake 1496 ml  Output 850 ml  Net 646 ml   Filed Weights   03/04/21 0205  Weight: 68 kg    Exam: General exam: In no acute distress. Respiratory system: Good air movement and clear to auscultation. Cardiovascular system: S1 & S2 heard, RRR. No JVD. Gastrointestinal system: Abdomen is nondistended, soft and nontender.  Extremities: No pedal edema. Skin: No rashes, lesions or ulcers Psychiatry: Judgement and insight appear normal. Mood & affect appropriate.    Data  Reviewed:    Labs: Basic Metabolic Panel: Recent Labs  Lab 03/03/21 2058 03/04/21 0258 03/05/21 0259  NA 138 138 137  K 4.3 3.9 3.6  CL 103 104 104  CO2 26 25 28   GLUCOSE 164* 210* 147*  BUN 15 15 10   CREATININE 1.00 1.05* 0.87  CALCIUM 8.9 8.9 8.3*   GFR Estimated Creatinine Clearance: 50.5 mL/min (by C-G formula based on SCr of 0.87 mg/dL). Liver Function Tests: No results for input(s): AST, ALT, ALKPHOS, BILITOT, PROT, ALBUMIN in the last 168 hours. No results for input(s): LIPASE, AMYLASE in the last 168 hours. No results for input(s): AMMONIA in the last 168 hours. Coagulation profile Recent Labs  Lab 03/03/21 2058  INR 1.1   COVID-19 Labs  Recent Labs    03/05/21 0259  FERRITIN 53    Lab Results  Component Value Date   SARSCOV2NAA NEGATIVE 03/03/2021    CBC: Recent Labs  Lab 03/03/21 2058 03/04/21 0258 03/05/21 0259  WBC 6.7 6.8 7.2  NEUTROABS 5.5  --   --   HGB 10.7* 10.6* 8.7*  HCT 35.4* 34.3* 28.2*  MCV 93.2 91.5 91.9  PLT 111* 110* 85*   Cardiac Enzymes: No results for input(s): CKTOTAL, CKMB, CKMBINDEX, TROPONINI in the last 168 hours. BNP (last  3 results) No results for input(s): PROBNP in the last 8760 hours. CBG: Recent Labs  Lab 03/04/21 1741 03/04/21 2054 03/05/21 0618 03/05/21 0823  GLUCAP 162* 184* 130* 137*   D-Dimer: No results for input(s): DDIMER in the last 72 hours. Hgb A1c: Recent Labs    03/04/21 0258  HGBA1C 6.3*   Lipid Profile: No results for input(s): CHOL, HDL, LDLCALC, TRIG, CHOLHDL, LDLDIRECT in the last 72 hours. Thyroid function studies: Recent Labs    03/05/21 0259  TSH 2.836   Anemia work up: Recent Labs    03/05/21 0259  VITAMINB12 160*  FOLATE 21.9  FERRITIN 53  TIBC 350  IRON 19*  RETICCTPCT 2.6   Sepsis Labs: Recent Labs  Lab 03/03/21 2058 03/04/21 0258 03/05/21 0259  WBC 6.7 6.8 7.2   Microbiology Recent Results (from the past 240 hour(s))  Resp Panel by RT-PCR (Flu  A&B, Covid) Nasopharyngeal Swab     Status: None   Collection Time: 03/03/21  9:12 PM   Specimen: Nasopharyngeal Swab; Nasopharyngeal(NP) swabs in vial transport medium  Result Value Ref Range Status   SARS Coronavirus 2 by RT PCR NEGATIVE NEGATIVE Final    Comment: (NOTE) SARS-CoV-2 target nucleic acids are NOT DETECTED.  The SARS-CoV-2 RNA is generally detectable in upper respiratory specimens during the acute phase of infection. The lowest concentration of SARS-CoV-2 viral copies this assay can detect is 138 copies/mL. A negative result does not preclude SARS-Cov-2 infection and should not be used as the sole basis for treatment or other patient management decisions. A negative result may occur with  improper specimen collection/handling, submission of specimen other than nasopharyngeal swab, presence of viral mutation(s) within the areas targeted by this assay, and inadequate number of viral copies(<138 copies/mL). A negative result must be combined with clinical observations, patient history, and epidemiological information. The expected result is Negative.  Fact Sheet for Patients:  BloggerCourse.com  Fact Sheet for Healthcare Providers:  SeriousBroker.it  This test is no t yet approved or cleared by the [place] FDA and  has been authorized for detection and/or diagnosis of SARS-CoV-2 by FDA under an Emergency Use Authorization (EUA). This EUA will remain  in effect (meaning this test can be used) for the duration of the COVID-19 declaration under Section 564(b)(1) of the Act, 21 U.S.C.section 360bbb-3(b)(1), unless the authorization is terminated  or revoked sooner.       Influenza A by PCR NEGATIVE NEGATIVE Final   Influenza B by PCR NEGATIVE NEGATIVE Final    Comment: (NOTE) The Xpert Xpress SARS-CoV-2/FLU/RSV plus assay is intended as an aid in the diagnosis of influenza from Nasopharyngeal swab specimens  and should not be used as a sole basis for treatment. Nasal washings and aspirates are unacceptable for Xpert Xpress SARS-CoV-2/FLU/RSV testing.  Fact Sheet for Patients: BloggerCourse.com  Fact Sheet for Healthcare Providers: SeriousBroker.it  This test is not yet approved or cleared by the [place] FDA and has been authorized for detection and/or diagnosis of SARS-CoV-2 by FDA under an Emergency Use Authorization (EUA). This EUA will remain in effect (meaning this test can be used) for the duration of the COVID-19 declaration under Section 564(b)(1) of the Act, 21 U.S.C. section 360bbb-3(b)(1), unless the authorization is terminated or revoked.  Performed at [facility], [address], [place], [place] [address]   Surgical pcr screen     Status: None   Collection Time: 03/04/21 11:42 AM   Specimen: Nasal Mucosa; Nasal Swab  Result  Value Ref Range Status   MRSA, PCR NEGATIVE NEGATIVE Final   Staphylococcus aureus NEGATIVE NEGATIVE Final    Comment: (NOTE) The Xpert SA Assay (FDA approved for NASAL specimens in patients 22 years of age and older), is one component of a comprehensive surveillance program. It is not intended to diagnose infection nor to guide or monitor treatment. Performed at [facility], [address] [place], [place] [address]      Medications:    aspirin       aspirin  81 mg Oral BID   docusate sodium  100 mg Oral BID   ezetimibe  10 mg Oral Daily   FLUoxetine  20 mg Oral Daily   insulin aspart  0-15 Units Subcutaneous TID WC   insulin aspart  0-5 Units Subcutaneous QHS   methocarbamol       pantoprazole  40 mg Oral BID   propranolol  10 mg Oral BID   traZODone  50 mg Oral QHS   Continuous Infusions:  sodium chloride 75 mL/hr at 03/05/21 0856   ceFAZolin     methocarbamol (ROBAXIN) IV        LOS: 2 days   Marinda Elk  [facility]  03/05/2021, 9:44  AM

## 2021-03-05 NOTE — Progress Notes (Signed)
   03/05/21 1322  PT Visit Information  Last PT Received On 03/05/21  Assistance Needed +1  History of Present Illness Alexis Cox is an 75 y.o. female admitted 11/29 after a mechanical fall and was found to have a right femoral neck fracture.  Pt underwent right THA direct anterior approach on 11/29.  PMH:  impaired glucose intolerance, fatty liver  Subjective Data  Patient Stated Goal to go home  Precautions  Precautions Fall  Restrictions  RLE Weight Bearing WBAT  Pain Assessment  Pain Assessment Faces  Faces Pain Scale 6  Pain Location right LE  Pain Descriptors / Indicators Aching;Grimacing;Guarding  Pain Intervention(s) Limited activity within patient's tolerance;Monitored during session;Repositioned  Cognition  Arousal/Alertness Awake/alert  Behavior During Therapy WFL for tasks assessed/performed  Overall Cognitive Status Within Functional Limits for tasks assessed  Bed Mobility  General bed mobility comments Pt was in bed and did not want to get back up.  General Comments  General comments (skin integrity, edema, etc.) Still on 3LO2 with sats 93%  Exercises  Exercises General Lower Extremity;Total Joint  Total Joint Exercises  Ankle Circles/Pumps AROM;Both;10 reps;Supine  Quad Sets AROM;Both;10 reps;Supine  Short Arc Quad AROM;Right;10 reps;Supine  Heel Slides AROM;Right;10 reps;Supine  Hip ABduction/ADduction AROM;Right;10 reps;Supine  PT - End of Session  Equipment Utilized During Treatment Gait belt;Oxygen  Activity Tolerance Patient limited by fatigue  Patient left in bed;with bed alarm set;with call bell/phone within reach  Nurse Communication Mobility status   PT - Assessment/Plan  PT Plan Current plan remains appropriate  PT Visit Diagnosis Unsteadiness on feet (R26.81);Muscle weakness (generalized) (M62.81);Pain  Pain - Right/Left Right  Pain - part of body Hip  PT Frequency (ACUTE ONLY) 7X/week  Follow Up Recommendations Follow physician's  recommendations for discharge plan and follow up therapies (Husband cant assist and pt currently mod assist therefore would benefit from SNF with therapy)  Assistance recommended at discharge Frequent or constant Supervision/Assistance  PT equipment None recommended by PT  AM-PAC PT "6 Clicks" Mobility Outcome Measure (Version 2)  Help needed turning from your back to your side while in a flat bed without using bedrails? 3  Help needed moving from lying on your back to sitting on the side of a flat bed without using bedrails? 2  Help needed moving to and from a bed to a chair (including a wheelchair)? 2  Help needed standing up from a chair using your arms (e.g., wheelchair or bedside chair)? 2  Help needed to walk in hospital room? 3  Help needed climbing 3-5 steps with a railing?  2  6 Click Score 14  Consider Recommendation of Discharge To: CIR/SNF/LTACH  Progressive Mobility  What is the highest level of mobility based on the progressive mobility assessment? Level 4 (Walks with assist in room) - Balance while marching in place and cannot step forward and back - Complete  Mobility Ambulated with assistance in room  PT Goal Progression  Progress towards PT goals Progressing toward goals  PT Time Calculation  PT Start Time (ACUTE ONLY) 1120  PT Stop Time (ACUTE ONLY) 1140  PT Time Calculation (min) (ACUTE ONLY) 20 min  PT Treatments  $Therapeutic Exercise 8-22 mins  Pt able to perform LE exercises during second session.  Pt reports she was fatigued from sitting in chair. Will continue PT.   Leveta Wahab M,PT Acute Rehab Services [phone] (pager)

## 2021-03-05 NOTE — Anesthesia Postprocedure Evaluation (Signed)
Anesthesia Post Note  Patient: Alexis Cox  Procedure(s) Performed: TOTAL HIP ARTHROPLASTY ANTERIOR APPROACH (Right: Hip)     Patient location during evaluation: PACU Anesthesia Type: Spinal Level of consciousness: awake and alert Pain management: pain level controlled Vital Signs Assessment: post-procedure vital signs reviewed and stable Respiratory status: spontaneous breathing and respiratory function stable Cardiovascular status: blood pressure returned to baseline and stable Postop Assessment: spinal receding Anesthetic complications: no   No notable events documented.                [name] DANIEL

## 2021-03-05 NOTE — Evaluation (Signed)
Physical Therapy Evaluation Patient Details Name: Alexis Cox MRN: [id number] DOB: 1945-12-07 Today's Date: 03/05/2021  History of Present Illness  Alexis Cox is an 75 y.o. female admitted 11/29 after a mechanical fall and was found to have a right femoral neck fracture.  Pt underwent right THA direct anterior approach on 11/29.  PMH:  impaired glucose intolerance, fatty liver  Clinical Impression  Pt admitted with above diagnosis. Pt was able to ambulate with RW but needed mod assist for sit to stand and min assist for gait. Husband cannot provide 24 hour care therefore pt would benefit from SNF short term prior to d/c home.  Pt currently with functional limitations due to the deficits listed below (see PT Problem List). Pt will benefit from skilled PT to increase their independence and safety with mobility to allow discharge to the venue listed below.          Recommendations for follow up therapy are one component of a multi-disciplinary discharge planning process, led by the attending physician.  Recommendations may be updated based on patient status, additional functional criteria and insurance authorization.  Follow Up Recommendations Follow physician's recommendations for discharge plan and follow up therapies (Husband cant assist and pt currently mod assist therefore would benefit from SNF with therapy)    Assistance Recommended at Discharge Frequent or constant Supervision/Assistance  Functional Status Assessment Patient has had a recent decline in their functional status and demonstrates the ability to make significant improvements in function in a reasonable and predictable amount of time.  Equipment Recommendations  None recommended by PT    Recommendations for Other Services       Precautions / Restrictions Precautions Precautions: Fall Restrictions RLE Weight Bearing: Weight bearing as tolerated      Mobility  Bed Mobility Overal bed mobility: Needs  Assistance Bed Mobility: Supine to Sit     Supine to sit: Min assist     General bed mobility comments: Needed a little assist for right LE to EOB and assist to elevate trunk    Transfers Overall transfer level: Needs assistance Equipment used: Rolling walker (2 wheels) Transfers: Sit to/from Stand Sit to Stand: Mod assist           General transfer comment: Needed mod assist to power up from bed and from the toilet.    Ambulation/Gait Ambulation/Gait assistance: Min assist;Min guard   Assistive device: Rolling walker (2 wheels) Gait Pattern/deviations: Step-to pattern;Decreased step length - right;Decreased stance time - right;Decreased weight shift to right;Antalgic   Gait velocity interpretation: <1.8 ft/sec, indicate of risk for recurrent falls   General Gait Details: Pt was able to ambulate with RW with min to min guard assist with cues for sequencing steps and RW as well as to stay in proximity to RW. Pt with antalgic gait and having difficulty putting weight ont he right LE.  Pt did report a little dizziness that she felt is due to medication.  Stairs            Wheelchair Mobility    Modified Rankin (Stroke Patients Only)       Balance Overall balance assessment: Needs assistance Sitting-balance support: No upper extremity supported;Feet supported Sitting balance-Leahy Scale: Fair     Standing balance support: Bilateral upper extremity supported;During functional activity Standing balance-Leahy Scale: Poor Standing balance comment: Needed RW and external support for balance  Pertinent Vitals/Pain Pain Assessment: Faces Faces Pain Scale: Hurts even more Pain Location: right LE Pain Descriptors / Indicators: Aching;Grimacing;Guarding    Home Living Family/patient expects to be discharged to:: Private residence Living Arrangements: Spouse/significant other;Children (husband who uses RW and cannot assist pt  much  and daughter there at times) Available Help at Discharge: Family;Available 24 hours/day Type of Home: Mobile home Home Access: Stairs to enter Entrance Stairs-Rails: Can reach both;Right;Left Entrance Stairs-Number of Steps: 2   Home Layout: One level Home Equipment: Agricultural consultant (2 wheels);BSC/3in1;Tub bench;Hand held shower head Additional Comments: fell 3 x times in last 6 months, pt reports husband does not get around well    Prior Function Prior Level of Function : Independent/Modified Independent;Driving;History of Falls (last six months);Needs assist               ADLs Comments: daughter assists with bath, pt dresses herself, feeds herself     Hand Dominance   Dominant Hand: Right    Extremity/Trunk Assessment   Upper Extremity Assessment Upper Extremity Assessment: Defer to OT evaluation    Lower Extremity Assessment Lower Extremity Assessment: RLE deficits/detail RLE Deficits / Details: grossly 3-/5       Communication   Communication: No difficulties  Cognition Arousal/Alertness: Awake/alert Behavior During Therapy: WFL for tasks assessed/performed Overall Cognitive Status: Within Functional Limits for tasks assessed                                          General Comments General comments (skin integrity, edema, etc.): 3LO2 on arrival and 93%; 85% on RA therefore replaced    Exercises General Exercises - Lower Extremity Ankle Circles/Pumps: AROM;Both;5 reps;Supine Quad Sets: AROM;5 reps;Both;Supine   Assessment/Plan    PT Assessment Patient needs continued PT services  PT Problem List Decreased activity tolerance;Decreased balance;Decreased mobility;Decreased knowledge of use of DME;Decreased safety awareness;Decreased knowledge of precautions;Pain       PT Treatment Interventions DME instruction;Gait training;Functional mobility training;Therapeutic activities;Therapeutic exercise;Balance training;Patient/family  education;Stair training    PT Goals (Current goals can be found in the Care Plan section)  Acute Rehab PT Goals Patient Stated Goal: to go home PT Goal Formulation: With patient Time For Goal Achievement: 03/21/21 Potential to Achieve Goals: Good    Frequency 7X/week   Barriers to discharge Decreased caregiver support (husband uses device and cant help pt)      Co-evaluation               AM-PAC PT "6 Clicks" Mobility  Outcome Measure Help needed turning from your back to your side while in a flat bed without using bedrails?: A Little Help needed moving from lying on your back to sitting on the side of a flat bed without using bedrails?: A Lot Help needed moving to and from a bed to a chair (including a wheelchair)?: A Lot Help needed standing up from a chair using your arms (e.g., wheelchair or bedside chair)?: A Lot Help needed to walk in hospital room?: A Little Help needed climbing 3-5 steps with a railing? : A Lot 6 Click Score: 14    End of Session Equipment Utilized During Treatment: Gait belt;Oxygen Activity Tolerance: Patient limited by fatigue Patient left: in chair;with call bell/phone within reach;with chair alarm set Nurse Communication: Mobility status PT Visit Diagnosis: Unsteadiness on feet (R26.81);Muscle weakness (generalized) (M62.81);Pain Pain - Right/Left: Right Pain - part of  body: Hip    Time: 8127-5170 PT Time Calculation (min) (ACUTE ONLY): 36 min   Charges:   PT Evaluation $PT Eval Moderate Complexity: 1 Mod PT Treatments $Gait Training: 8-22 mins        Kinslie Hove M,PT Acute Rehab Services [phone] (pager)   [name] 03/05/2021, 1:17 PM

## 2021-03-05 NOTE — Discharge Instructions (Signed)

## 2021-03-06 DIAGNOSIS — E119 Type 2 diabetes mellitus without complications: Secondary | ICD-10-CM | POA: Diagnosis not present

## 2021-03-06 LAB — CBC
HCT: 21.8 % — ABNORMAL LOW (ref 36.0–46.0)
Hemoglobin: 6.7 g/dL — CL (ref 12.0–15.0)
MCH: 28 pg (ref 26.0–34.0)
MCHC: 30.7 g/dL (ref 30.0–36.0)
MCV: 91.2 fL (ref 80.0–100.0)
Platelets: 67 10*3/uL — ABNORMAL LOW (ref 150–400)
RBC: 2.39 MIL/uL — ABNORMAL LOW (ref 3.87–5.11)
RDW: 15.6 % — ABNORMAL HIGH (ref 11.5–15.5)
WBC: 5.2 10*3/uL (ref 4.0–10.5)
nRBC: 0 % (ref 0.0–0.2)

## 2021-03-06 LAB — RESP PANEL BY RT-PCR (FLU A&B, COVID) ARPGX2
Influenza A by PCR: NEGATIVE
Influenza B by PCR: NEGATIVE
SARS Coronavirus 2 by RT PCR: NEGATIVE

## 2021-03-06 LAB — GLUCOSE, CAPILLARY
Glucose-Capillary: 115 mg/dL — ABNORMAL HIGH (ref 70–99)
Glucose-Capillary: 169 mg/dL — ABNORMAL HIGH (ref 70–99)
Glucose-Capillary: 153 mg/dL — ABNORMAL HIGH (ref 70–99)
Glucose-Capillary: 136 mg/dL — ABNORMAL HIGH (ref 70–99)

## 2021-03-06 LAB — HEMOGLOBIN AND HEMATOCRIT, BLOOD
HCT: 31 % — ABNORMAL LOW (ref 36.0–46.0)
Hemoglobin: 10.3 g/dL — ABNORMAL LOW (ref 12.0–15.0)

## 2021-03-06 LAB — PREPARE RBC (CROSSMATCH)

## 2021-03-06 MED ORDER — OXYCODONE HCL 10 MG PO TABS: 10.0000 mg | ORAL_TABLET | ORAL | 0 refills | Status: DC | PRN

## 2021-03-06 MED ORDER — ASPIRIN 81 MG PO CHEW: 81.0000 mg | CHEWABLE_TABLET | Freq: Two times a day (BID) | ORAL | Status: DC

## 2021-03-06 MED ORDER — SODIUM CHLORIDE 0.9% IV SOLUTION: Freq: Once | INTRAVENOUS | Status: AC

## 2021-03-06 NOTE — Care Management Important Message (Signed)
Important Message  Patient Details  Name: Alexis Cox MRN: [id number] Date of Birth: 25-May-1945   Medicare Important Message Given:  Yes     Sherilyn Banker 03/06/2021, 2:17 PM

## 2021-03-06 NOTE — Progress Notes (Signed)
Physical Therapy Treatment Patient Details Name: Alexis Cox MRN: [id number] DOB: 01-22-1946 Today's Date: 03/06/2021   History of Present Illness Alexis Cox is an 75 y.o. female admitted 11/29 after a mechanical fall and was found to have a right femoral neck fracture.  Pt underwent right THA direct anterior approach on 11/29.  PMH:  impaired glucose intolerance, fatty liver    PT Comments    Pt admitted with above diagnosis. Pt was able to ambulate with RW and continues to be limited by dizziness and pain. Pt is receiving blood and BP dropped after a short walk.  Also pt needs at least 1LO2 to keep sats >90%.  Will continue to follow acutely.  Pt currently with functional limitations due to balance and endurance deficits. Pt will benefit from skilled PT to increase their independence and safety with mobility to allow discharge to the venue listed below.      Recommendations for follow up therapy are one component of a multi-disciplinary discharge planning process, led by the attending physician.  Recommendations may be updated based on patient status, additional functional criteria and insurance authorization.  Follow Up Recommendations  Follow physician's recommendations for discharge plan and follow up therapies (Husband cant assist and pt currently mod assist therefore would benefit from SNF with therapy)     Assistance Recommended at Discharge Frequent or constant Supervision/Assistance  Equipment Recommendations  None recommended by PT    Recommendations for Other Services       Precautions / Restrictions Precautions Precautions: Fall Restrictions RLE Weight Bearing: Weight bearing as tolerated     Mobility  Bed Mobility Overal bed mobility: Needs Assistance Bed Mobility: Supine to Sit     Supine to sit: Min assist     General bed mobility comments: A little assist for right LE and for trunk elevation. Needs incr time.    Transfers Overall transfer  level: Needs assistance Equipment used: Rolling walker (2 wheels) Transfers: Sit to/from Stand Sit to Stand: Mod assist           General transfer comment: Needed mod assist to power up from bed    Ambulation/Gait Ambulation/Gait assistance: Min assist;Min guard Gait Distance (Feet): 24 Feet Assistive device: Rolling walker (2 wheels) Gait Pattern/deviations: Step-to pattern;Decreased step length - right;Decreased stance time - right;Decreased weight shift to right;Antalgic   Gait velocity interpretation: <1.8 ft/sec, indicate of risk for recurrent falls   General Gait Details: Nurse approved for pt to ambulate even though he had just started her first unit of blood.  Pts BP was 98/60 on arrival.  O2 on RA was 91%.  Pt was able to ambulate with RW with min to min guard assist with cues for sequencing steps and RW as well as to stay in proximity to RW. Pt with antalgic gait and having difficulty putting weight ont he right LE.  Pt did report a little dizziness toward end of walk. BP 82/57 with HR 70 bpm after walk with sat down to 87%.  Nurse ended up placing O2 at 1L to keep sats >90%.   Stairs             Wheelchair Mobility    Modified Rankin (Stroke Patients Only)       Balance Overall balance assessment: Needs assistance Sitting-balance support: No upper extremity supported;Feet supported Sitting balance-Leahy Scale: Fair     Standing balance support: Bilateral upper extremity supported;During functional activity Standing balance-Leahy Scale: Poor Standing balance comment: Needed RW and external  support for balance                            Cognition Arousal/Alertness: Awake/alert Behavior During Therapy: WFL for tasks assessed/performed Overall Cognitive Status: Within Functional Limits for tasks assessed                                          Exercises Total Joint Exercises Ankle Circles/Pumps: AROM;Both;10  reps;Supine Quad Sets: AROM;Both;10 reps;Supine Short Arc Quad: AROM;Right;10 reps;Supine Heel Slides: AROM;Right;10 reps;Supine Hip ABduction/ADduction: AROM;Right;10 reps;Supine    General Comments        Pertinent Vitals/Pain Pain Assessment: Faces Faces Pain Scale: Hurts even more Pain Location: right LE Pain Descriptors / Indicators: Aching;Grimacing;Guarding Pain Intervention(s): Limited activity within patient's tolerance;Monitored during session;Repositioned;Premedicated before session    Home Living                          Prior Function            PT Goals (current goals can now be found in the care plan section) Acute Rehab PT Goals Patient Stated Goal: to go home Progress towards PT goals: Progressing toward goals    Frequency    7X/week      PT Plan Current plan remains appropriate    Co-evaluation              AM-PAC PT "6 Clicks" Mobility   Outcome Measure  Help needed turning from your back to your side while in a flat bed without using bedrails?: A Little Help needed moving from lying on your back to sitting on the side of a flat bed without using bedrails?: A Lot Help needed moving to and from a bed to a chair (including a wheelchair)?: A Lot Help needed standing up from a chair using your arms (e.g., wheelchair or bedside chair)?: A Lot Help needed to walk in hospital room?: A Little Help needed climbing 3-5 steps with a railing? : A Lot 6 Click Score: 14    End of Session Equipment Utilized During Treatment: Gait belt;Oxygen Activity Tolerance: Patient limited by fatigue Patient left: with call bell/phone within reach;in chair;with chair alarm set;with nursing/sitter in room Nurse Communication: Mobility status PT Visit Diagnosis: Unsteadiness on feet (R26.81);Muscle weakness (generalized) (M62.81);Pain Pain - Right/Left: Right Pain - part of body: Hip     Time: 1040-1103 PT Time Calculation (min) (ACUTE ONLY): 23  min  Charges:  $Gait Training: 8-22 mins $Therapeutic Exercise: 8-22 mins                     Alexis Cox M,PT Acute Rehab Services [phone] (pager)    Alexis Cox 03/06/2021, 12:08 PM

## 2021-03-06 NOTE — TOC Progression Note (Addendum)
Transition of Care [facility]) - Progression Note    Patient Details  Name: Alexis Cox MRN: [id number] Date of Birth: 02-05-1946  Transition of Care [facility]) CM/SW Contact  Lorri Frederick, LCSW Phone Number: 03/06/2021, 2:56 PM  Clinical Narrative:  CSW presented bed offers to pt, she chooses [facility] in [place].  CSW spoke with Okey Regal at [facility] who can accept pt tomorrow once authorization is obtained.    Auth submitted in [place].    Covid test negative today.      Expected Discharge Plan: Skilled Nursing Facility Barriers to Discharge: Continued Medical Work up, SNF Pending bed offer  Expected Discharge Plan and Services Expected Discharge Plan: Skilled Nursing Facility In-house Referral: Clinical Social Work   Post Acute Care Choice: Skilled Nursing Facility Living arrangements for the past 2 months: Single Family Home Expected Discharge Date: 03/06/21                                     Social Determinants of Health (SDOH) Interventions    Readmission Risk Interventions No flowsheet data found.

## 2021-03-06 NOTE — Progress Notes (Signed)
03/06/21 1300  PT Visit Information  Last PT Received On 03/06/21  Assistance Needed +1  History of Present Illness OSHA RANE is an 75 y.o. female admitted 11/29 after a mechanical fall and was found to have a right femoral neck fracture.  Pt underwent right THA direct anterior approach on 11/29.  PMH:  impaired glucose intolerance, fatty liver  Subjective Data  Patient Stated Goal to go home  Precautions  Precautions Fall  Restrictions  RLE Weight Bearing WBAT  Pain Assessment  Pain Assessment Faces  Faces Pain Scale 6  Pain Location right LE  Pain Descriptors / Indicators Aching;Grimacing;Guarding  Pain Intervention(s) Limited activity within patient's tolerance;Monitored during session;Repositioned  Cognition  Arousal/Alertness Awake/alert  Behavior During Therapy WFL for tasks assessed/performed  Overall Cognitive Status Within Functional Limits for tasks assessed  Bed Mobility  General bed mobility comments in chair on arrival  Transfers  Overall transfer level Needs assistance  Equipment used Rolling walker (2 wheels)  Transfers Sit to/from Stand  Sit to Stand Mod assist;Min assist  General transfer comment Needed min to  mod assist to power up from recliner  Balance  Standing balance support Bilateral upper extremity supported;During functional activity  Standing balance-Leahy Scale Poor  Standing balance comment Needed RW and external support for balance  General Comments  General comments (skin integrity, edema, etc.) On 1LO2 with sats >90%.  BP 110/48 and HR 68 bpm pre exercise.  After exercises, 125/95 with HR 108 bpm.  Exercises  Exercises General Lower Extremity;Total Joint  Total Joint Exercises  Hip ABduction/ADduction AROM;Right;10 reps;Standing  Knee Flexion AROM;10 reps;Right;Standing  Marching in Standing AROM;Right;10 reps;Standing  PT - End of Session  Equipment Utilized During Treatment Gait belt;Oxygen  Activity Tolerance Patient limited by  fatigue  Patient left with call bell/phone within reach;in chair;with chair alarm set  Nurse Communication Mobility status   PT - Assessment/Plan  PT Plan Current plan remains appropriate  PT Visit Diagnosis Unsteadiness on feet (R26.81);Muscle weakness (generalized) (M62.81);Pain  Pain - Right/Left Right  Pain - part of body Hip  PT Frequency (ACUTE ONLY) 7X/week  Follow Up Recommendations Follow physician's recommendations for discharge plan and follow up therapies (Husband cant assist and pt currently mod assist therefore would benefit from SNF with therapy)  Assistance recommended at discharge Frequent or constant Supervision/Assistance  PT equipment None recommended by PT  AM-PAC PT "6 Clicks" Mobility Outcome Measure (Version 2)  Help needed turning from your back to your side while in a flat bed without using bedrails? 3  Help needed moving from lying on your back to sitting on the side of a flat bed without using bedrails? 2  Help needed moving to and from a bed to a chair (including a wheelchair)? 2  Help needed standing up from a chair using your arms (e.g., wheelchair or bedside chair)? 2  Help needed to walk in hospital room? 3  Help needed climbing 3-5 steps with a railing?  2  6 Click Score 14  Consider Recommendation of Discharge To: CIR/SNF/LTACH  Progressive Mobility  What is the highest level of mobility based on the progressive mobility assessment? Level 4 (Walks with assist in room) - Balance while marching in place and cannot step forward and back - Complete  Mobility Ambulated with assistance in room  PT Goal Progression  Progress towards PT goals Progressing toward goals  PT Time Calculation  PT Start Time (ACUTE ONLY) 1130  PT Stop Time (ACUTE ONLY) 1150  PT Time Calculation (min) (ACUTE ONLY) 20 min  PT General Charges  $$ ACUTE PT VISIT 1 Visit  PT Treatments  $Therapeutic Exercise 8-22 mins  Pt was able to tolerate performing standing exercises but  fatigues quickly.  Pt BP improved from earlier treatment and she was almost finished with receiving blood.   Christy Ehrsam M,PT Acute Rehab Services [phone] (pager)

## 2021-03-06 NOTE — Discharge Summary (Signed)
Physician Discharge Summary  Alexis SchlichterSandra F Cox ZOX:[id number] DOB: 09/06/1945 DOA: 03/03/2021  PCP: Ailene RavelHamrick, Maura L, MD  Admit date: 03/03/2021 Discharge date: 03/06/2021  Admitted From: Home Disposition:  SNF  Recommendations for Outpatient Follow-up:  Follow up with PCP in 1-2 weeks Please obtain BMP/CBC in one week   Home Health:No Equipment/Devices:none  Discharge Condition:Stable CODE STATUS:Full Diet recommendation: Heart Healthy  Brief/Interim Summary: 75 y.o. 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 Physical therapy evaluated the patient and the recommended skilled nursing facility. She will continue narcotics and aspirin twice a day per orthopedics DVT prophylaxis.  Prolonged QTC/cardiomegaly: Repeated twelve-lead EKG showed QTC of 480. Echocardiogram was done EF of 55% no regional wall motion abnormalities grade 1 diastolic heart failure.  Hyperglycemia: Likely reactive, A1c of 6.3 several days after surgical intervention improved.  Fatty liver: Follow-up with the PCP as an outpatient.  Normocytic anemia: No signs of overt bleeding her hemoglobin dropped to 6.7 she was transfused 2 units of packed red blood cells  History of anxiety and depression: Continue fluoxetine.  Hyperlipidemia: Continue Setia  Discharge Instructions  Discharge Instructions     Diet - low sodium heart healthy   Complete by: As directed    Increase activity slowly   Complete by:  As directed    No wound care   Complete by: As directed       Allergies as of 03/06/2021   No Known Allergies      Medication List     TAKE these medications    aspirin 81 MG chewable tablet Chew 1 tablet (81 mg total) by mouth 2 (two) times daily.   ezetimibe 10 MG tablet Commonly known as: ZETIA Take 10 mg by mouth daily.   FLUoxetine 20 MG tablet Commonly known as: PROZAC Take 20 mg by mouth daily.   MULTI VITAMIN DAILY PO Take 2 tablets by mouth daily. gummy   omeprazole 20 MG capsule Commonly known as: PRILOSEC Take 20 mg by mouth 2 (two) times daily before a meal.   Oxycodone HCl 10 MG Tabs Take 1-1.5 tablets (10-15 mg total) by mouth every 4 (four) hours as needed for severe pain (pain score 7-10).   propranolol 10 MG tablet Commonly known as: INDERAL Take 10 mg by mouth 2 (two) times daily.   traZODone 50 MG tablet Commonly known as: DESYREL Take 50 mg by mouth at bedtime.               Durable Medical Equipment  (From admission, onward)           Start     Ordered   03/04/21 1732  DME 3 n 1  Once        03/04/21 1732   03/04/21 1732  DME Walker rolling  Once       Question Answer Comment  Walker: With 5 Inch Wheels   Patient needs a walker to treat with the following condition Status post total replacement of right hip      03/04/21 1732  Follow-up Information     Mcarthur Rossetti, MD. Schedule an appointment as soon as possible for a visit.   Specialty: Orthopedic Surgery Contact information: [address] [place] [address] [phone]                No Known Allergies  Consultations: Orthopedic surgery   Procedures/Studies: DG Chest 1 View  Result Date: 03/03/2021 CLINICAL DATA:  Initial evaluation for preoperative evaluation. EXAM: CHEST  1 VIEW COMPARISON:  None available. FINDINGS: Cardiomegaly. Mediastinal silhouette within normal limits. Aortic atherosclerosis. Lungs mildly  hypoinflated. Chronic coarsening of the interstitial markings with diffuse pulmonary interstitial congestion. No overt pulmonary edema. No pleural effusion. No pneumothorax. No acute osseous finding. IMPRESSION: 1. Cardiomegaly with diffuse pulmonary interstitial congestion without overt pulmonary edema. 2.  Aortic Atherosclerosis (ICD10-I70.0). Electronically Signed   By: Jeannine Boga M.D.   On: 03/03/2021 20:10   DG Pelvis Portable  Result Date: 03/04/2021 CLINICAL DATA:  Status post right hip replacement EXAM: PORTABLE PELVIS 1-2 VIEWS COMPARISON:  Pelvis radiographs dated 1 day prior, intraoperative hip radiographs obtained earlier the same day FINDINGS: Postsurgical changes reflecting right hip arthroplasty are seen. Hardware alignment is within expected limits, without evidence of hardware related complication. There is expected postoperative soft tissue gas around the right hip. Mild degenerative changes about the left hip are unchanged. The SI joints and symphysis pubis are intact. IMPRESSION: Status post right hip replacement without evidence of complication. Electronically Signed   By: Valetta Mole M.D.   On: 03/04/2021 15:50   DG C-Arm 1-60 Min-No Report  Result Date: 03/04/2021 Fluoroscopy was utilized by the requesting physician.  No radiographic interpretation.   ECHOCARDIOGRAM COMPLETE  Result Date: 03/05/2021    ECHOCARDIOGRAM REPORT   Patient Name:   Alexis Cox Date of Exam: 03/05/2021 Medical Rec #:  [id number]         Height:       62.0 in Accession #:    [id number]        Weight:       150.0 lb Date of Birth:  11/15/1945          BSA:          1.692 m Patient Age:    65 years          BP:           131/57 mmHg Patient Gender: F                 HR:           66 bpm. Exam Location:  Inpatient Procedure: 2D Echo, Color Doppler and Cardiac Doppler Indications:    I51.7 Cardiomegaly  History:        Patient has no prior history of Echocardiogram examinations.                  Risk Factors:Hypertension.  Sonographer:    Raquel Sarna Senior RDCS Referring Phys: [place] Comments: Technically difficult due to poor echo windows. Scanned supine due to recent hip surgery. IMPRESSIONS  1. Left ventricular ejection fraction, by estimation, is 55 to 60%. The left ventricle has normal function. The left ventricle has no regional wall motion abnormalities. Left ventricular diastolic parameters are consistent with Grade I diastolic dysfunction (impaired relaxation).  2. Right ventricular systolic function is normal. The right ventricular size is normal. There is mildly elevated pulmonary artery systolic pressure.  3. There is moderate posterior mitral annular calcification. No  evidence of mitral valve regurgitation. No evidence of mitral stenosis.  4. The aortic valve is normal in structure. Aortic valve regurgitation is not visualized. Aortic valve sclerosis/calcification is present, without any evidence of aortic stenosis.  5. The inferior vena cava is normal in size with greater than 50% respiratory variability, suggesting right atrial pressure of 3 mmHg. Comparison(s): No prior Echocardiogram. FINDINGS  Left Ventricle: Left ventricular ejection fraction, by estimation, is 55 to 60%. The left ventricle has normal function. The left ventricle has no regional wall motion abnormalities. The left ventricular internal cavity size was normal in size. There is  no left ventricular hypertrophy. Left ventricular diastolic parameters are consistent with Grade I diastolic dysfunction (impaired relaxation). Right Ventricle: The right ventricular size is normal. No increase in right ventricular wall thickness. Right ventricular systolic function is normal. There is mildly elevated pulmonary artery systolic pressure. The tricuspid regurgitant velocity is 3.09  m/s, and with an assumed right atrial pressure of 3 mmHg, the estimated right ventricular systolic pressure is [id number] mmHg. Left  Atrium: Left atrial size was normal in size. Right Atrium: Right atrial size was normal in size. Pericardium: There is no evidence of pericardial effusion. Presence of epicardial fat layer. Mitral Valve: The mitral valve is abnormal. Moderate mitral annular calcification. No evidence of mitral valve regurgitation. No evidence of mitral valve stenosis. Tricuspid Valve: The tricuspid valve is normal in structure. Tricuspid valve regurgitation is not demonstrated. No evidence of tricuspid stenosis. Aortic Valve: The aortic valve is normal in structure. There is moderate aortic valve annular calcification. Aortic valve regurgitation is not visualized. Aortic valve sclerosis/calcification is present, without any evidence of aortic stenosis. Pulmonic Valve: The pulmonic valve was normal in structure. Pulmonic valve regurgitation is not visualized. No evidence of pulmonic stenosis. Aorta: The aortic root is normal in size and structure. Venous: The inferior vena cava is normal in size with greater than 50% respiratory variability, suggesting right atrial pressure of 3 mmHg. IAS/Shunts: No atrial level shunt detected by color flow Doppler.  LEFT VENTRICLE PLAX 2D LVIDd:         4.40 cm   Diastology LVIDs:         2.70 cm   LV e' medial:    5.66 cm/s LV PW:         0.80 cm   LV E/e' medial:  17.7 LV IVS:        0.70 cm   LV e' lateral:   8.49 cm/s LVOT diam:     2.40 cm   LV E/e' lateral: 11.8 LV SV:         112 LV SV Index:   66 LVOT Area:     4.52 cm  RIGHT VENTRICLE RV S prime:     20.00 cm/s TAPSE (M-mode): 1.8 cm LEFT ATRIUM             Index        RIGHT ATRIUM           Index LA diam:        3.50 cm 2.07 cm/m   RA Area:     14.00 cm LA Vol (A2C):   55.4 ml 32.75 ml/m  RA Volume:   32.40 ml  19.15 ml/m LA Vol (A4C):   39.0 ml 23.05 ml/m LA Biplane Vol: 47.7 ml 28.20 ml/m  AORTIC VALVE LVOT Vmax:   110.00 cm/s LVOT Vmean:  77.300 cm/s LVOT VTI:    0.248 m  AORTA Ao Root  diam: 2.80 cm Ao Asc diam:  2.90 cm MITRAL  VALVE                TRICUSPID VALVE MV Area (PHT): 3.83 cm     TR Peak grad:   38.2 mmHg MV Decel Time: 198 msec     TR Vmax:        309.00 cm/s MV E velocity: 100.00 cm/s MV A velocity: 109.00 cm/s  SHUNTS MV E/A ratio:  0.92         Systemic VTI:  0.25 m                             Systemic Diam: 2.40 cm Alexis Tobb DO Electronically signed by Berniece Salines DO Signature Date/Time: 03/05/2021/1:54:31 PM    Final    DG HIP OPERATIVE UNILAT WITH PELVIS RIGHT  Result Date: 03/04/2021 CLINICAL DATA:  Right hip replacement EXAM: OPERATIVE right HIP (WITH PELVIS IF PERFORMED) 2 VIEWS TECHNIQUE: Fluoroscopic spot image(s) were submitted for interpretation post-operatively. COMPARISON:  Pelvis radiographs dated 1 day prior FINDINGS: There has been interval right hip arthroplasty. Hardware alignment is within expected limits, without evidence of hardware related complication. The pubic symphysis is intact. Fluoro time 17 seconds. IMPRESSION: Status post right hip arthroplasty without evidence of complication. Electronically Signed   By: Valetta Mole M.D.   On: 03/04/2021 15:36   DG Hip Unilat With Pelvis 2-3 Views Right  Result Date: 03/03/2021 CLINICAL DATA:  Fall with hip pain EXAM: DG HIP (WITH OR WITHOUT PELVIS) 2-3V RIGHT COMPARISON:  None. FINDINGS: SI joints are non widened. Pubic symphysis and rami appear intact. Acute right femoral neck fracture with mild displacement and angulation. IMPRESSION: Acute displaced and mildly angulated right femoral neck fracture Electronically Signed   By: Donavan Foil M.D.   On: 03/03/2021 20:10   (Echo, Carotid, EGD, Colonoscopy, ERCP)    Subjective: No complaints  Discharge Exam: Vitals:   03/06/21 0632 03/06/21 0748  BP: (!) 110/52 125/66  Pulse: 73 67  Resp: 18 18  Temp: 98.6 F (37 C) 97.7 F (36.5 C)  SpO2: 93% 100%   Vitals:   03/06/21 0426 03/06/21 0614 03/06/21 0632 03/06/21 0748  BP: (!) 102/55 (!) 107/54 (!) 110/52 125/66  Pulse: 60 65 73 67   Resp: 18 19 18 18   Temp: 98.6 F (37 C) 98.2 F (36.8 C) 98.6 F (37 C) 97.7 F (36.5 C)  TempSrc: Oral Oral Oral   SpO2: 96% 98% 93% 100%  Weight:      Height:        General: Pt is alert, awake, not in acute distress Cardiovascular: RRR, S1/S2 +, no rubs, no gallops Respiratory: CTA bilaterally, no wheezing, no rhonchi Abdominal: Soft, NT, ND, bowel sounds + Extremities: no edema, no cyanosis    The results of significant diagnostics from this hospitalization (including imaging, microbiology, ancillary and laboratory) are listed below for reference.     Microbiology: Recent Results (from the past 240 hour(s))  Resp Panel by RT-PCR (Flu A&B, Covid) Nasopharyngeal Swab     Status: None   Collection Time: 03/03/21  9:12 PM   Specimen: Nasopharyngeal Swab; Nasopharyngeal(NP) swabs in vial transport medium  Result Value Ref Range Status   SARS Coronavirus 2 by RT PCR NEGATIVE NEGATIVE Final    Comment: (NOTE) SARS-CoV-2 target nucleic acids are NOT DETECTED.  The SARS-CoV-2 RNA is generally detectable in upper respiratory specimens during the  acute phase of infection. The lowest concentration of SARS-CoV-2 viral copies this assay can detect is 138 copies/mL. A negative result does not preclude SARS-Cov-2 infection and should not be used as the sole basis for treatment or other patient management decisions. A negative result may occur with  improper specimen collection/handling, submission of specimen other than nasopharyngeal swab, presence of viral mutation(s) within the areas targeted by this assay, and inadequate number of viral copies(<138 copies/mL). A negative result must be combined with clinical observations, patient history, and epidemiological information. The expected result is Negative.  Fact Sheet for Patients:  EntrepreneurPulse.com.au  Fact Sheet for Healthcare Providers:  IncredibleEmployment.be  This test is no t  yet approved or cleared by the [place] FDA and  has been authorized for detection and/or diagnosis of SARS-CoV-2 by FDA under an Emergency Use Authorization (EUA). This EUA will remain  in effect (meaning this test can be used) for the duration of the COVID-19 declaration under Section 564(b)(1) of the Act, 21 U.S.C.section 360bbb-3(b)(1), unless the authorization is terminated  or revoked sooner.       Influenza A by PCR NEGATIVE NEGATIVE Final   Influenza B by PCR NEGATIVE NEGATIVE Final    Comment: (NOTE) The Xpert Xpress SARS-CoV-2/FLU/RSV plus assay is intended as an aid in the diagnosis of influenza from Nasopharyngeal swab specimens and should not be used as a sole basis for treatment. Nasal washings and aspirates are unacceptable for Xpert Xpress SARS-CoV-2/FLU/RSV testing.  Fact Sheet for Patients: EntrepreneurPulse.com.au  Fact Sheet for Healthcare Providers: IncredibleEmployment.be  This test is not yet approved or cleared by the [place] FDA and has been authorized for detection and/or diagnosis of SARS-CoV-2 by FDA under an Emergency Use Authorization (EUA). This EUA will remain in effect (meaning this test can be used) for the duration of the COVID-19 declaration under Section 564(b)(1) of the Act, 21 U.S.C. section 360bbb-3(b)(1), unless the authorization is terminated or revoked.  Performed at [facility], [place] [address], [place], [place] [address]   Surgical pcr screen     Status: None   Collection Time: 03/04/21 11:42 AM   Specimen: Nasal Mucosa; Nasal Swab  Result Value Ref Range Status   MRSA, PCR NEGATIVE NEGATIVE Final   Staphylococcus aureus NEGATIVE NEGATIVE Final    Comment: (NOTE) The Xpert SA Assay (FDA approved for NASAL specimens in patients 44 years of age and older), is one component of a comprehensive surveillance program. It is not intended to diagnose infection nor to guide or monitor  treatment. Performed at [facility], [place] [address] [place],  [address]      Labs: BNP (last 3 results) No results for input(s): BNP in the last 8760 hours. Basic Metabolic Panel: Recent Labs  Lab 03/03/21 2058 03/04/21 0258 03/05/21 0259  NA 138 138 137  K 4.3 3.9 3.6  CL 103 104 104  CO2 26 25 28   GLUCOSE 164* 210* 147*  BUN 15 15 10   CREATININE 1.00 1.05* 0.87  CALCIUM 8.9 8.9 8.3*   Liver Function Tests: No results for input(s): AST, ALT, ALKPHOS, BILITOT, PROT, ALBUMIN in the last 168 hours. No results for input(s): LIPASE, AMYLASE in the last 168 hours. No results for input(s): AMMONIA in the last 168 hours. CBC: Recent Labs  Lab 03/03/21 2058 03/04/21 0258 03/05/21 0259 03/06/21 0205  WBC 6.7 6.8 7.2 5.2  NEUTROABS 5.5  --   --   --   HGB 10.7* 10.6* 8.7*  6.7*  HCT 35.4* 34.3* 28.2* 21.8*  MCV 93.2 91.5 91.9 91.2  PLT 111* 110* 85* 67*   Cardiac Enzymes: No results for input(s): CKTOTAL, CKMB, CKMBINDEX, TROPONINI in the last 168 hours. BNP: Invalid input(s): POCBNP CBG: Recent Labs  Lab 03/05/21 0823 03/05/21 1214 03/05/21 1613 03/05/21 2037 03/06/21 0749  GLUCAP 137* 154* 116* 171* 115*   D-Dimer No results for input(s): DDIMER in the last 72 hours. Hgb A1c Recent Labs    03/04/21 0258  HGBA1C 6.3*   Lipid Profile No results for input(s): CHOL, HDL, LDLCALC, TRIG, CHOLHDL, LDLDIRECT in the last 72 hours. Thyroid function studies Recent Labs    03/05/21 0259  TSH 2.836   Anemia work up Recent Labs    03/05/21 0259  VITAMINB12 160*  FOLATE 21.9  FERRITIN 53  TIBC 350  IRON 19*  RETICCTPCT 2.6   Urinalysis No results found for: COLORURINE, APPEARANCEUR, LABSPEC, [place], GLUCOSEU, HGBUR, BILIRUBINUR, KETONESUR, PROTEINUR, UROBILINOGEN, NITRITE, LEUKOCYTESUR Sepsis Labs Invalid input(s): PROCALCITONIN,  WBC,  LACTICIDVEN Microbiology Recent Results (from the past 240 hour(s))  Resp Panel by RT-PCR (Flu A&B,  Covid) Nasopharyngeal Swab     Status: None   Collection Time: 03/03/21  9:12 PM   Specimen: Nasopharyngeal Swab; Nasopharyngeal(NP) swabs in vial transport medium  Result Value Ref Range Status   SARS Coronavirus 2 by RT PCR NEGATIVE NEGATIVE Final    Comment: (NOTE) SARS-CoV-2 target nucleic acids are NOT DETECTED.  The SARS-CoV-2 RNA is generally detectable in upper respiratory specimens during the acute phase of infection. The lowest concentration of SARS-CoV-2 viral copies this assay can detect is 138 copies/mL. A negative result does not preclude SARS-Cov-2 infection and should not be used as the sole basis for treatment or other patient management decisions. A negative result may occur with  improper specimen collection/handling, submission of specimen other than nasopharyngeal swab, presence of viral mutation(s) within the areas targeted by this assay, and inadequate number of viral copies(<138 copies/mL). A negative result must be combined with clinical observations, patient history, and epidemiological information. The expected result is Negative.  Fact Sheet for Patients:  EntrepreneurPulse.com.au  Fact Sheet for Healthcare Providers:  IncredibleEmployment.be  This test is no t yet approved or cleared by the [place] FDA and  has been authorized for detection and/or diagnosis of SARS-CoV-2 by FDA under an Emergency Use Authorization (EUA). This EUA will remain  in effect (meaning this test can be used) for the duration of the COVID-19 declaration under Section 564(b)(1) of the Act, 21 U.S.C.section 360bbb-3(b)(1), unless the authorization is terminated  or revoked sooner.       Influenza A by PCR NEGATIVE NEGATIVE Final   Influenza B by PCR NEGATIVE NEGATIVE Final    Comment: (NOTE) The Xpert Xpress SARS-CoV-2/FLU/RSV plus assay is intended as an aid in the diagnosis of influenza from Nasopharyngeal swab specimens and should  not be used as a sole basis for treatment. Nasal washings and aspirates are unacceptable for Xpert Xpress SARS-CoV-2/FLU/RSV testing.  Fact Sheet for Patients: EntrepreneurPulse.com.au  Fact Sheet for Healthcare Providers: IncredibleEmployment.be  This test is not yet approved or cleared by the [place] FDA and has been authorized for detection and/or diagnosis of SARS-CoV-2 by FDA under an Emergency Use Authorization (EUA). This EUA will remain in effect (meaning this test can be used) for the duration of the COVID-19 declaration under Section 564(b)(1) of the Act, 21 U.S.C. section 360bbb-3(b)(1), unless the authorization is terminated or revoked.  Performed at  [facility], [address], [place], [place] [address]   Surgical pcr screen     Status: None   Collection Time: 03/04/21 11:42 AM   Specimen: Nasal Mucosa; Nasal Swab  Result Value Ref Range Status   MRSA, PCR NEGATIVE NEGATIVE Final   Staphylococcus aureus NEGATIVE NEGATIVE Final    Comment: (NOTE) The Xpert SA Assay (FDA approved for NASAL specimens in patients 49 years of age and older), is one component of a comprehensive surveillance program. It is not intended to diagnose infection nor to guide or monitor treatment. Performed at [facility], [address], [place], [place] [address]     SIGNED:   Marinda Elk, MD  [facility] 03/06/2021, 9:40 AM Pager   If 7PM-7AM, please contact night-coverage [email or web address] Password [email or web address]

## 2021-03-06 NOTE — Progress Notes (Signed)
Subjective: 2 Days Post-Op Procedure(s) (LRB): TOTAL HIP ARTHROPLASTY ANTERIOR APPROACH (Right) Patient reports pain as mild.  Awake and alert .  Nursing staff in room hanging a unit of blood.   Objective: Vital signs in last 24 hours: Temp:  [97.7 F (36.5 C)-99.6 F (37.6 C)] 99.6 F (37.6 C) (12/01 1030) Pulse Rate:  [60-78] 76 (12/01 1030) Resp:  [18-19] 18 (12/01 1030) BP: (98-125)/(52-66) 98/60 (12/01 1030) SpO2:  [93 %-100 %] 97 % (12/01 1030)  Intake/Output from previous day: No intake/output data recorded. Intake/Output this shift: Total I/O In: 723.3 [Blood:723.3] Out: -   Recent Labs    03/03/21 2058 03/04/21 0258 03/05/21 0259 03/06/21 0205  HGB 10.7* 10.6* 8.7* 6.7*   Recent Labs    03/05/21 0259 03/06/21 0205  WBC 7.2 5.2  RBC 3.07*  3.04* 2.39*  HCT 28.2* 21.8*  PLT 85* 67*   Recent Labs    03/04/21 0258 03/05/21 0259  NA 138 137  K 3.9 3.6  CL 104 104  CO2 25 28  BUN 15 10  CREATININE 1.05* 0.87  GLUCOSE 210* 147*  CALCIUM 8.9 8.3*   Recent Labs    03/03/21 2058  INR 1.1    Sensation intact distally Dorsiflexion/Plantar flexion intact Compartment soft Dressing clean dry and intact.    Assessment/Plan: 2 Days Post-Op Procedure(s) (LRB): TOTAL HIP ARTHROPLASTY ANTERIOR APPROACH (Right) Post op anemia receiving blood transfusion today.  Monitor hip for any bleeding (third spacing).      Pollie Poma 03/06/2021, 10:47 AM

## 2021-03-07 DIAGNOSIS — R2689 Other abnormalities of gait and mobility: Secondary | ICD-10-CM | POA: Diagnosis not present

## 2021-03-07 DIAGNOSIS — I5030 Unspecified diastolic (congestive) heart failure: Secondary | ICD-10-CM | POA: Diagnosis not present

## 2021-03-07 DIAGNOSIS — Z4789 Encounter for other orthopedic aftercare: Secondary | ICD-10-CM | POA: Diagnosis not present

## 2021-03-07 DIAGNOSIS — R9431 Abnormal electrocardiogram [ECG] [EKG]: Secondary | ICD-10-CM | POA: Diagnosis not present

## 2021-03-07 DIAGNOSIS — Z9889 Other specified postprocedural states: Secondary | ICD-10-CM | POA: Diagnosis not present

## 2021-03-07 DIAGNOSIS — S79929A Unspecified injury of unspecified thigh, initial encounter: Secondary | ICD-10-CM | POA: Diagnosis not present

## 2021-03-07 DIAGNOSIS — S72041D Displaced fracture of base of neck of right femur, subsequent encounter for closed fracture with routine healing: Secondary | ICD-10-CM | POA: Diagnosis not present

## 2021-03-07 DIAGNOSIS — R6889 Other general symptoms and signs: Secondary | ICD-10-CM | POA: Diagnosis not present

## 2021-03-07 DIAGNOSIS — R262 Difficulty in walking, not elsewhere classified: Secondary | ICD-10-CM | POA: Diagnosis not present

## 2021-03-07 DIAGNOSIS — E119 Type 2 diabetes mellitus without complications: Secondary | ICD-10-CM | POA: Diagnosis not present

## 2021-03-07 DIAGNOSIS — R0902 Hypoxemia: Secondary | ICD-10-CM | POA: Diagnosis not present

## 2021-03-07 DIAGNOSIS — Z743 Need for continuous supervision: Secondary | ICD-10-CM | POA: Diagnosis not present

## 2021-03-07 DIAGNOSIS — K760 Fatty (change of) liver, not elsewhere classified: Secondary | ICD-10-CM | POA: Diagnosis not present

## 2021-03-07 DIAGNOSIS — I700 Atherosclerosis of aorta: Secondary | ICD-10-CM | POA: Diagnosis not present

## 2021-03-07 DIAGNOSIS — S72001A Fracture of unspecified part of neck of right femur, initial encounter for closed fracture: Secondary | ICD-10-CM | POA: Diagnosis not present

## 2021-03-07 DIAGNOSIS — Z9181 History of falling: Secondary | ICD-10-CM | POA: Diagnosis not present

## 2021-03-07 DIAGNOSIS — M6281 Muscle weakness (generalized): Secondary | ICD-10-CM | POA: Diagnosis not present

## 2021-03-07 DIAGNOSIS — Z96641 Presence of right artificial hip joint: Secondary | ICD-10-CM | POA: Diagnosis not present

## 2021-03-07 DIAGNOSIS — W19XXXA Unspecified fall, initial encounter: Secondary | ICD-10-CM | POA: Diagnosis not present

## 2021-03-07 DIAGNOSIS — D649 Anemia, unspecified: Secondary | ICD-10-CM | POA: Diagnosis not present

## 2021-03-07 LAB — TYPE AND SCREEN
ABO/RH(D): O POS
Antibody Screen: NEGATIVE
Unit division: 0
Unit division: 0

## 2021-03-07 LAB — CBC
HCT: 30.4 % — ABNORMAL LOW (ref 36.0–46.0)
Hemoglobin: 10 g/dL — ABNORMAL LOW (ref 12.0–15.0)
MCH: 28.9 pg (ref 26.0–34.0)
MCHC: 32.9 g/dL (ref 30.0–36.0)
MCV: 87.9 fL (ref 80.0–100.0)
Platelets: 72 10*3/uL — ABNORMAL LOW (ref 150–400)
RBC: 3.46 MIL/uL — ABNORMAL LOW (ref 3.87–5.11)
RDW: 15.6 % — ABNORMAL HIGH (ref 11.5–15.5)
WBC: 5.7 10*3/uL (ref 4.0–10.5)
nRBC: 0 % (ref 0.0–0.2)

## 2021-03-07 LAB — GLUCOSE, CAPILLARY
Glucose-Capillary: 133 mg/dL — ABNORMAL HIGH (ref 70–99)
Glucose-Capillary: 121 mg/dL — ABNORMAL HIGH (ref 70–99)
Glucose-Capillary: 144 mg/dL — ABNORMAL HIGH (ref 70–99)

## 2021-03-07 LAB — BPAM RBC
Blood Product Expiration Date: 202212212359
Blood Product Expiration Date: 202212282359
ISSUE DATE / TIME: 202212010603
ISSUE DATE / TIME: 202212011025
Unit Type and Rh: 5100
Unit Type and Rh: 5100

## 2021-03-07 NOTE — Discharge Summary (Signed)
Physician Discharge Summary  Alexis Cox [id number] DOB: [birth year] DOA: 03/03/2021  PCP: Alexis Sake, MD  Admit date: 03/03/2021 Discharge date: 03/07/2021  Admitted From: Home Disposition:  SNF  Recommendations for Outpatient Follow-up:  Follow up with PCP in 1-2 weeks Please obtain BMP/CBC in one week   Home Health:No Equipment/Devices:none  Discharge Condition:Stable CODE STATUS:Full Diet recommendation: Heart Healthy  Brief/Interim Summary: 75 y.o. 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 Physical therapy evaluated the patient and the recommended skilled nursing facility. She will continue narcotics and aspirin twice a day per orthopedics DVT prophylaxis. She had no changes overnight.  Prolonged QTC/cardiomegaly: Repeated twelve-lead EKG showed QTC of 480. Echocardiogram was done EF of 55% no regional wall motion abnormalities grade 1 diastolic heart failure.  Hyperglycemia: Likely reactive, A1c of 6.3 several days after surgical intervention improved.  Fatty liver: Follow-up with the PCP as an outpatient.  Normocytic anemia: No signs of overt bleeding her hemoglobin dropped to 6.7 she was transfused 2 units of packed red blood cells  History of anxiety and depression: Continue fluoxetine.  Hyperlipidemia: Continue Setia  Discharge Instructions  Discharge Instructions     Diet - low sodium heart healthy   Complete by: As directed    Increase  activity slowly   Complete by: As directed    No wound care   Complete by: As directed       Allergies as of 03/07/2021   No Known Allergies      Medication List     TAKE these medications    aspirin 81 MG chewable tablet Chew 1 tablet (81 mg total) by mouth 2 (two) times daily.   ezetimibe 10 MG tablet Commonly known as: ZETIA Take 10 mg by mouth daily.   FLUoxetine 20 MG tablet Commonly known as: PROZAC Take 20 mg by mouth daily.   MULTI VITAMIN DAILY PO Take 2 tablets by mouth daily. gummy   omeprazole 20 MG capsule Commonly known as: PRILOSEC Take 20 mg by mouth 2 (two) times daily before a meal.   Oxycodone HCl 10 MG Tabs Take 1-1.5 tablets (10-15 mg total) by mouth every 4 (four) hours as needed for severe pain (pain score 7-10).   propranolol 10 MG tablet Commonly known as: INDERAL Take 10 mg by mouth 2 (two) times daily.   traZODone 50 MG tablet Commonly known as: DESYREL Take 50 mg by mouth at bedtime.               Durable Medical Equipment  (From admission, onward)           Start     Ordered   03/04/21 1732  DME 3 n 1  Once        03/04/21 1732   03/04/21 1732  DME Walker rolling  Once       Question Answer Comment  Walker: With 5 Inch Wheels   Patient needs a walker to treat with the following condition Status post total replacement of right hip  03/04/21 1732            Follow-up Information     Alexis Rossetti, MD. Schedule an appointment as soon as possible for a visit.   Specialty: Orthopedic Surgery Contact information: [address] [place] [address] [phone]                No Known Allergies  Consultations: Orthopedic surgery   Procedures/Studies: DG Chest 1 View  Result Date: 03/03/2021 CLINICAL DATA:  Initial evaluation for preoperative evaluation. EXAM: CHEST  1 VIEW COMPARISON:  None available. FINDINGS: Cardiomegaly. Mediastinal silhouette within normal limits. Aortic  atherosclerosis. Lungs mildly hypoinflated. Chronic coarsening of the interstitial markings with diffuse pulmonary interstitial congestion. No overt pulmonary edema. No pleural effusion. No pneumothorax. No acute osseous finding. IMPRESSION: 1. Cardiomegaly with diffuse pulmonary interstitial congestion without overt pulmonary edema. 2.  Aortic Atherosclerosis (ICD10-I70.0). Electronically Signed   By: Alexis Cox M.D.   On: 03/03/2021 20:10   DG Pelvis Portable  Result Date: 03/04/2021 CLINICAL DATA:  Status post right hip replacement EXAM: PORTABLE PELVIS 1-2 VIEWS COMPARISON:  Pelvis radiographs dated 1 day prior, intraoperative hip radiographs obtained earlier the same day FINDINGS: Postsurgical changes reflecting right hip arthroplasty are seen. Hardware alignment is within expected limits, without evidence of hardware related complication. There is expected postoperative soft tissue gas around the right hip. Mild degenerative changes about the left hip are unchanged. The SI joints and symphysis pubis are intact. IMPRESSION: Status post right hip replacement without evidence of complication. Electronically Signed   By: Alexis Cox M.D.   On: 03/04/2021 15:50   DG C-Arm 1-60 Min-No Report  Result Date: 03/04/2021 Fluoroscopy was utilized by the requesting physician.  No radiographic interpretation.   ECHOCARDIOGRAM COMPLETE  Result Date: 03/05/2021    ECHOCARDIOGRAM REPORT   Patient Name:   Alexis Cox Date of Exam: 03/05/2021 Medical Rec #:  [id number]         Height:       62.0 in Accession #:    [id number]        Weight:       150.0 lb Date of Birth:  Feb 25, 1946          BSA:          1.692 m Patient Age:    59 years          BP:           131/57 mmHg Patient Gender: F                 HR:           66 bpm. Exam Location:  Inpatient Procedure: 2D Echo, Color Doppler and Cardiac Doppler Indications:    I51.7 Cardiomegaly  History:        Patient has no prior history of  Echocardiogram examinations.                 Risk Factors:Hypertension.  Sonographer:    Alexis Cox Senior RDCS Referring Phys: Alexis Cox Comments: Technically difficult due to poor echo windows. Scanned supine due to recent hip surgery. IMPRESSIONS  1. Left ventricular ejection fraction, by estimation, is 55 to 60%. The left ventricle has normal function. The left ventricle has no regional wall motion abnormalities. Left ventricular diastolic parameters are consistent with Grade I diastolic dysfunction (impaired relaxation).  2. Right ventricular systolic function is normal. The right ventricular size is normal. There is mildly elevated pulmonary  artery systolic pressure.  3. There is moderate posterior mitral annular calcification. No evidence of mitral valve regurgitation. No evidence of mitral stenosis.  4. The aortic valve is normal in structure. Aortic valve regurgitation is not visualized. Aortic valve sclerosis/calcification is present, without any evidence of aortic stenosis.  5. The inferior vena cava is normal in size with greater than 50% respiratory variability, suggesting right atrial pressure of 3 mmHg. Comparison(s): No prior Echocardiogram. FINDINGS  Left Ventricle: Left ventricular ejection fraction, by estimation, is 55 to 60%. The left ventricle has normal function. The left ventricle has no regional wall motion abnormalities. The left ventricular internal cavity size was normal in size. There is  no left ventricular hypertrophy. Left ventricular diastolic parameters are consistent with Grade I diastolic dysfunction (impaired relaxation). Right Ventricle: The right ventricular size is normal. No increase in right ventricular wall thickness. Right ventricular systolic function is normal. There is mildly elevated pulmonary artery systolic pressure. The tricuspid regurgitant velocity is 3.09  m/s, and with an assumed right atrial pressure of 3 mmHg, the estimated right  ventricular systolic pressure is [id number] mmHg. Left Atrium: Left atrial size was normal in size. Right Atrium: Right atrial size was normal in size. Pericardium: There is no evidence of pericardial effusion. Presence of epicardial fat layer. Mitral Valve: The mitral valve is abnormal. Moderate mitral annular calcification. No evidence of mitral valve regurgitation. No evidence of mitral valve stenosis. Tricuspid Valve: The tricuspid valve is normal in structure. Tricuspid valve regurgitation is not demonstrated. No evidence of tricuspid stenosis. Aortic Valve: The aortic valve is normal in structure. There is moderate aortic valve annular calcification. Aortic valve regurgitation is not visualized. Aortic valve sclerosis/calcification is present, without any evidence of aortic stenosis. Pulmonic Valve: The pulmonic valve was normal in structure. Pulmonic valve regurgitation is not visualized. No evidence of pulmonic stenosis. Aorta: The aortic root is normal in size and structure. Venous: The inferior vena cava is normal in size with greater than 50% respiratory variability, suggesting right atrial pressure of 3 mmHg. IAS/Shunts: No atrial level shunt detected by color flow Doppler.  LEFT VENTRICLE PLAX 2D LVIDd:         4.40 cm   Diastology LVIDs:         2.70 cm   LV e' medial:    5.66 cm/s LV PW:         0.80 cm   LV E/e' medial:  17.7 LV IVS:        0.70 cm   LV e' lateral:   8.49 cm/s LVOT diam:     2.40 cm   LV E/e' lateral: 11.8 LV SV:         112 LV SV Index:   66 LVOT Area:     4.52 cm  RIGHT VENTRICLE RV S prime:     20.00 cm/s TAPSE (M-mode): 1.8 cm LEFT ATRIUM             Index        RIGHT ATRIUM           Index LA diam:        3.50 cm 2.07 cm/m   RA Area:     14.00 cm LA Vol (A2C):   55.4 ml 32.75 ml/m  RA Volume:   32.40 ml  19.15 ml/m LA Vol (A4C):   39.0 ml 23.05 ml/m LA Biplane Vol: 47.7 ml 28.20 ml/m  AORTIC VALVE LVOT Vmax:   110.00 cm/s LVOT Vmean:  77.300 cm/s LVOT VTI:    0.248 m  AORTA Ao  Root diam: 2.80 cm Ao Asc diam:  2.90 cm MITRAL VALVE                TRICUSPID VALVE MV Area (PHT): 3.83 cm     TR Peak grad:   38.2 mmHg MV Decel Time: 198 msec     TR Vmax:        309.00 cm/s MV E velocity: 100.00 cm/s MV A velocity: 109.00 cm/s  SHUNTS MV E/A ratio:  0.92         Systemic VTI:  0.25 m                             Systemic Diam: 2.40 cm Kardie Tobb DO Electronically signed by Berniece Salines DO Signature Date/Time: 03/05/2021/1:54:31 PM    Final    DG HIP OPERATIVE UNILAT WITH PELVIS RIGHT  Result Date: 03/04/2021 CLINICAL DATA:  Right hip replacement EXAM: OPERATIVE right HIP (WITH PELVIS IF PERFORMED) 2 VIEWS TECHNIQUE: Fluoroscopic spot image(s) were submitted for interpretation post-operatively. COMPARISON:  Pelvis radiographs dated 1 day prior FINDINGS: There has been interval right hip arthroplasty. Hardware alignment is within expected limits, without evidence of hardware related complication. The pubic symphysis is intact. Fluoro time 17 seconds. IMPRESSION: Status post right hip arthroplasty without evidence of complication. Electronically Signed   By: Alexis Cox M.D.   On: 03/04/2021 15:36   DG Hip Unilat With Pelvis 2-3 Views Right  Result Date: 03/03/2021 CLINICAL DATA:  Fall with hip pain EXAM: DG HIP (WITH OR WITHOUT PELVIS) 2-3V RIGHT COMPARISON:  None. FINDINGS: SI joints are non widened. Pubic symphysis and rami appear intact. Acute right femoral neck fracture with mild displacement and angulation. IMPRESSION: Acute displaced and mildly angulated right femoral neck fracture Electronically Signed   By: Donavan Foil M.D.   On: 03/03/2021 20:10   (Echo, Carotid, EGD, Colonoscopy, ERCP)    Subjective: No complaints  Discharge Exam: Vitals:   03/07/21 0446 03/07/21 0848  BP: (!) 112/47 (!) 117/55  Pulse: 66 62  Resp: 16 18  Temp: 97.9 F (36.6 C) 99 F (37.2 C)  SpO2: 92%    Vitals:   03/06/21 2004 03/06/21 2218 03/07/21 0446 03/07/21 0848  BP: (!) 148/62  (!) 159/70 (!) 112/47 (!) 117/55  Pulse: 64 75 66 62  Resp: 20  16 18   Temp: 98 F (36.7 C)  97.9 F (36.6 C) 99 F (37.2 C)  TempSrc: Oral  Oral Oral  SpO2: 94%  92%   Weight:      Height:        General: Pt is alert, awake, not in acute distress Cardiovascular: RRR, S1/S2 +, no rubs, no gallops Respiratory: CTA bilaterally, no wheezing, no rhonchi Abdominal: Soft, NT, ND, bowel sounds + Extremities: no edema, no cyanosis    The results of significant diagnostics from this hospitalization (including imaging, microbiology, ancillary and laboratory) are listed below for reference.     Microbiology: Recent Results (from the past 240 hour(s))  Resp Panel by RT-PCR (Flu A&B, Covid) Nasopharyngeal Swab     Status: None   Collection Time: 03/03/21  9:12 PM   Specimen: Nasopharyngeal Swab; Nasopharyngeal(NP) swabs in vial transport medium  Result Value Ref Range Status   SARS Coronavirus 2 by RT PCR NEGATIVE NEGATIVE Final    Comment: (NOTE) SARS-CoV-2 target nucleic acids are NOT DETECTED.  The SARS-CoV-2 RNA is generally detectable in upper respiratory specimens during the acute phase of infection. The lowest concentration of SARS-CoV-2 viral copies this assay can detect is 138 copies/mL. A negative result does not preclude SARS-Cov-2 infection and should not be used as the sole basis for treatment or other patient management decisions. A negative result may occur with  improper specimen collection/handling, submission of specimen other than nasopharyngeal swab, presence of viral mutation(s) within the areas targeted by this assay, and inadequate number of viral copies(<138 copies/mL). A negative result must be combined with clinical observations, patient history, and epidemiological information. The expected result is Negative.  Fact Sheet for Patients:  BloggerCourse.com  Fact Sheet for Healthcare Providers:   SeriousBroker.it  This test is no t yet approved or cleared by the [place] FDA and  has been authorized for detection and/or diagnosis of SARS-CoV-2 by FDA under an Emergency Use Authorization (EUA). This EUA will remain  in effect (meaning this test can be used) for the duration of the COVID-19 declaration under Section 564(b)(1) of the Act, 21 U.S.C.section 360bbb-3(b)(1), unless the authorization is terminated  or revoked sooner.       Influenza A by PCR NEGATIVE NEGATIVE Final   Influenza B by PCR NEGATIVE NEGATIVE Final    Comment: (NOTE) The Xpert Xpress SARS-CoV-2/FLU/RSV plus assay is intended as an aid in the diagnosis of influenza from Nasopharyngeal swab specimens and should not be used as a sole basis for treatment. Nasal washings and aspirates are unacceptable for Xpert Xpress SARS-CoV-2/FLU/RSV testing.  Fact Sheet for Patients: BloggerCourse.com  Fact Sheet for Healthcare Providers: SeriousBroker.it  This test is not yet approved or cleared by the [place] FDA and has been authorized for detection and/or diagnosis of SARS-CoV-2 by FDA under an Emergency Use Authorization (EUA). This EUA will remain in effect (meaning this test can be used) for the duration of the COVID-19 declaration under Section 564(b)(1) of the Act, 21 U.S.C. section 360bbb-3(b)(1), unless the authorization is terminated or revoked.  Performed at [facility], [address] [place], [place] [address]   Surgical pcr screen     Status: None   Collection Time: 03/04/21 11:42 AM   Specimen: Nasal Mucosa; Nasal Swab  Result Value Ref Range Status   MRSA, PCR NEGATIVE NEGATIVE Final   Staphylococcus aureus NEGATIVE NEGATIVE Final    Comment: (NOTE) The Xpert SA Assay (FDA approved for NASAL specimens in patients 33 years of age and older), is one component of a comprehensive surveillance program.  It is not intended to diagnose infection nor to guide or monitor treatment. Performed at [facility], [address], [place], [place] [address]   Resp Panel by RT-PCR (Flu A&B, Covid) Nasopharyngeal Swab     Status: None   Collection Time: 03/06/21  9:09 AM   Specimen: Nasopharyngeal Swab; Nasopharyngeal(NP) swabs in vial transport medium  Result Value Ref Range Status   SARS Coronavirus 2 by RT PCR NEGATIVE NEGATIVE Final    Comment: (NOTE) SARS-CoV-2 target nucleic acids are NOT DETECTED.  The SARS-CoV-2 RNA is generally detectable in upper respiratory specimens during the acute phase of infection. The lowest concentration of SARS-CoV-2 viral copies this assay can detect is 138 copies/mL. A negative result does not preclude SARS-Cov-2 infection and should not be used as the sole basis for treatment or other patient management decisions. A negative result may occur with  improper specimen collection/handling, submission of specimen other than nasopharyngeal swab, presence  of viral mutation(s) within the areas targeted by this assay, and inadequate number of viral copies(<138 copies/mL). A negative result must be combined with clinical observations, patient history, and epidemiological information. The expected result is Negative.  Fact Sheet for Patients:  EntrepreneurPulse.com.au  Fact Sheet for Healthcare Providers:  IncredibleEmployment.be  This test is no t yet approved or cleared by the [place] FDA and  has been authorized for detection and/or diagnosis of SARS-CoV-2 by FDA under an Emergency Use Authorization (EUA). This EUA will remain  in effect (meaning this test can be used) for the duration of the COVID-19 declaration under Section 564(b)(1) of the Act, 21 U.S.C.section 360bbb-3(b)(1), unless the authorization is terminated  or revoked sooner.       Influenza A by PCR NEGATIVE NEGATIVE Final   Influenza B by PCR  NEGATIVE NEGATIVE Final    Comment: (NOTE) The Xpert Xpress SARS-CoV-2/FLU/RSV plus assay is intended as an aid in the diagnosis of influenza from Nasopharyngeal swab specimens and should not be used as a sole basis for treatment. Nasal washings and aspirates are unacceptable for Xpert Xpress SARS-CoV-2/FLU/RSV testing.  Fact Sheet for Patients: EntrepreneurPulse.com.au  Fact Sheet for Healthcare Providers: IncredibleEmployment.be  This test is not yet approved or cleared by the [place] FDA and has been authorized for detection and/or diagnosis of SARS-CoV-2 by FDA under an Emergency Use Authorization (EUA). This EUA will remain in effect (meaning this test can be used) for the duration of the COVID-19 declaration under Section 564(b)(1) of the Act, 21 U.S.C. section 360bbb-3(b)(1), unless the authorization is terminated or revoked.  Performed at [facility], [place] [address], [place], [place] [address]      Labs: BNP (last 3 results) No results for input(s): BNP in the last 8760 hours. Basic Metabolic Panel: Recent Labs  Lab 03/03/21 2058 03/04/21 0258 03/05/21 0259  NA 138 138 137  K 4.3 3.9 3.6  CL 103 104 104  CO2 26 25 28   GLUCOSE 164* 210* 147*  BUN 15 15 10   CREATININE 1.00 1.05* 0.87  CALCIUM 8.9 8.9 8.3*    Liver Function Tests: No results for input(s): AST, ALT, ALKPHOS, BILITOT, PROT, ALBUMIN in the last 168 hours. No results for input(s): LIPASE, AMYLASE in the last 168 hours. No results for input(s): AMMONIA in the last 168 hours. CBC: Recent Labs  Lab 03/03/21 2058 03/04/21 0258 03/05/21 0259 03/06/21 0205 03/06/21 1505 03/07/21 0309  WBC 6.7 6.8 7.2 5.2  --  5.7  NEUTROABS 5.5  --   --   --   --   --   HGB 10.7* 10.6* 8.7* 6.7* 10.3* 10.0*  HCT 35.4* 34.3* 28.2* 21.8* 31.0* 30.4*  MCV 93.2 91.5 91.9 91.2  --  87.9  PLT 111* 110* 85* 67*  --  72*    Cardiac Enzymes: No results for input(s):  CKTOTAL, CKMB, CKMBINDEX, TROPONINI in the last 168 hours. BNP: Invalid input(s): POCBNP CBG: Recent Labs  Lab 03/06/21 1144 03/06/21 1540 03/06/21 2003 03/07/21 0636 03/07/21 0827  GLUCAP 169* 153* 136* 133* 121*    D-Dimer No results for input(s): DDIMER in the last 72 hours. Hgb A1c No results for input(s): HGBA1C in the last 72 hours.  Lipid Profile No results for input(s): CHOL, HDL, LDLCALC, TRIG, CHOLHDL, LDLDIRECT in the last 72 hours. Thyroid function studies Recent Labs    03/05/21 0259  TSH 2.836    Anemia work up Recent Labs    03/05/21 0259  VITAMINB12 160*  FOLATE 21.9  FERRITIN 53  TIBC 350  IRON 19*  RETICCTPCT 2.6    Urinalysis No results found for: COLORURINE, APPEARANCEUR, LABSPEC, PHURINE, GLUCOSEU, HGBUR, BILIRUBINUR, KETONESUR, PROTEINUR, UROBILINOGEN, NITRITE, LEUKOCYTESUR Sepsis Labs Invalid input(s): PROCALCITONIN,  WBC,  LACTICIDVEN Microbiology Recent Results (from the past 240 hour(s))  Resp Panel by RT-PCR (Flu A&B, Covid) Nasopharyngeal Swab     Status: None   Collection Time: 03/03/21  9:12 PM   Specimen: Nasopharyngeal Swab; Nasopharyngeal(NP) swabs in vial transport medium  Result Value Ref Range Status   SARS Coronavirus 2 by RT PCR NEGATIVE NEGATIVE Final    Comment: (NOTE) SARS-CoV-2 target nucleic acids are NOT DETECTED.  The SARS-CoV-2 RNA is generally detectable in upper respiratory specimens during the acute phase of infection. The lowest concentration of SARS-CoV-2 viral copies this assay can detect is 138 copies/mL. A negative result does not preclude SARS-Cov-2 infection and should not be used as the sole basis for treatment or other patient management decisions. A negative result may occur with  improper specimen collection/handling, submission of specimen other than nasopharyngeal swab, presence of viral mutation(s) within the areas targeted by this assay, and inadequate number of viral copies(<138 copies/mL).  A negative result must be combined with clinical observations, patient history, and epidemiological information. The expected result is Negative.  Fact Sheet for Patients:  BloggerCourse.comhttps://www.fda.gov/media/152166/download  Fact Sheet for Healthcare Providers:  SeriousBroker.ithttps://www.fda.gov/media/152162/download  This test is no t yet approved or cleared by the [place] FDA and  has been authorized for detection and/or diagnosis of SARS-CoV-2 by FDA under an Emergency Use Authorization (EUA). This EUA will remain  in effect (meaning this test can be used) for the duration of the COVID-19 declaration under Section 564(b)(1) of the Act, 21 U.S.C.section 360bbb-3(b)(1), unless the authorization is terminated  or revoked sooner.       Influenza A by PCR NEGATIVE NEGATIVE Final   Influenza B by PCR NEGATIVE NEGATIVE Final    Comment: (NOTE) The Xpert Xpress SARS-CoV-2/FLU/RSV plus assay is intended as an aid in the diagnosis of influenza from Nasopharyngeal swab specimens and should not be used as a sole basis for treatment. Nasal washings and aspirates are unacceptable for Xpert Xpress SARS-CoV-2/FLU/RSV testing.  Fact Sheet for Patients: BloggerCourse.comhttps://www.fda.gov/media/152166/download  Fact Sheet for Healthcare Providers: SeriousBroker.ithttps://www.fda.gov/media/152162/download  This test is not yet approved or cleared by the [place] FDA and has been authorized for detection and/or diagnosis of SARS-CoV-2 by FDA under an Emergency Use Authorization (EUA). This EUA will remain in effect (meaning this test can be used) for the duration of the COVID-19 declaration under Section 564(b)(1) of the Act, 21 U.S.C. section 360bbb-3(b)(1), unless the authorization is terminated or revoked.  Performed at [facility], [address] [place], [place] [address]   Surgical pcr screen     Status: None   Collection Time: 03/04/21 11:42 AM   Specimen: Nasal Mucosa; Nasal Swab  Result Value Ref Range Status    MRSA, PCR NEGATIVE NEGATIVE Final   Staphylococcus aureus NEGATIVE NEGATIVE Final    Comment: (NOTE) The Xpert SA Assay (FDA approved for NASAL specimens in patients 75 years of age and older), is one component of a comprehensive surveillance program. It is not intended to diagnose infection nor to guide or monitor treatment. Performed at [facility], [address] [place], [place] [address]   Resp Panel by RT-PCR (Flu A&B, Covid) Nasopharyngeal Swab     Status: None  Collection Time: 03/06/21  9:09 AM   Specimen: Nasopharyngeal Swab; Nasopharyngeal(NP) swabs in vial transport medium  Result Value Ref Range Status   SARS Coronavirus 2 by RT PCR NEGATIVE NEGATIVE Final    Comment: (NOTE) SARS-CoV-2 target nucleic acids are NOT DETECTED.  The SARS-CoV-2 RNA is generally detectable in upper respiratory specimens during the acute phase of infection. The lowest concentration of SARS-CoV-2 viral copies this assay can detect is 138 copies/mL. A negative result does not preclude SARS-Cov-2 infection and should not be used as the sole basis for treatment or other patient management decisions. A negative result may occur with  improper specimen collection/handling, submission of specimen other than nasopharyngeal swab, presence of viral mutation(s) within the areas targeted by this assay, and inadequate number of viral copies(<138 copies/mL). A negative result must be combined with clinical observations, patient history, and epidemiological information. The expected result is Negative.  Fact Sheet for Patients:  EntrepreneurPulse.com.au  Fact Sheet for Healthcare Providers:  IncredibleEmployment.be  This test is no t yet approved or cleared by the [place] FDA and  has been authorized for detection and/or diagnosis of SARS-CoV-2 by FDA under an Emergency Use Authorization (EUA). This EUA will remain  in effect (meaning this test can  be used) for the duration of the COVID-19 declaration under Section 564(b)(1) of the Act, 21 U.S.C.section 360bbb-3(b)(1), unless the authorization is terminated  or revoked sooner.       Influenza A by PCR NEGATIVE NEGATIVE Final   Influenza B by PCR NEGATIVE NEGATIVE Final    Comment: (NOTE) The Xpert Xpress SARS-CoV-2/FLU/RSV plus assay is intended as an aid in the diagnosis of influenza from Nasopharyngeal swab specimens and should not be used as a sole basis for treatment. Nasal washings and aspirates are unacceptable for Xpert Xpress SARS-CoV-2/FLU/RSV testing.  Fact Sheet for Patients: EntrepreneurPulse.com.au  Fact Sheet for Healthcare Providers: IncredibleEmployment.be  This test is not yet approved or cleared by the [place] FDA and has been authorized for detection and/or diagnosis of SARS-CoV-2 by FDA under an Emergency Use Authorization (EUA). This EUA will remain in effect (meaning this test can be used) for the duration of the COVID-19 declaration under Section 564(b)(1) of the Act, 21 U.S.C. section 360bbb-3(b)(1), unless the authorization is terminated or revoked.  Performed at [facility], [place] [address], [place], [place] [address]     SIGNED:   Charlynne Cousins, MD  [facility] 03/07/2021, 9:12 AM Pager   If 7PM-7AM, please contact night-coverage [email or web address] Password [email or web address]

## 2021-03-07 NOTE — Progress Notes (Signed)
Report called to [facility] in [place]

## 2021-03-07 NOTE — Plan of Care (Signed)
  Problem: Activity: Goal: Risk for activity intolerance will decrease Outcome: Progressing   Problem: Safety: Goal: Ability to remain free from injury will improve Outcome: Progressing   

## 2021-03-07 NOTE — TOC Transition Note (Signed)
Transition of Care [facility] - CM/SW Discharge Note   Patient Details  Name: Alexis Cox MRN: [id number] Date of Birth: [birth year]  Transition of Care [facility]) CM/SW Contact:  Lorri Frederick, LCSW Phone Number: 03/07/2021, 10:23 AM   Clinical Narrative:   Pt discharging to [facility] [place].  RN call report to [phone].  PTAR called [phone].    Final next level of care: Skilled Nursing Facility Barriers to Discharge: Barriers Resolved   Patient Goals and CMS Choice Patient states their goals for this hospitalization and ongoing recovery are:: "walking good" CMS Medicare.gov Compare Post Acute Care list provided to:: Patient Choice offered to / list presented to : Patient  Discharge Placement              Patient chooses bed at:  ([facility] [place]) Patient to be transferred to facility by: PTAR Name of family member notified: husband [name] Patient and family notified of of transfer: 03/07/21  Discharge Plan and Services In-house Referral: Clinical Social Work   Post Acute Care Choice: Skilled Nursing Facility                               Social Determinants of Health (SDOH) Interventions     Readmission Risk Interventions No flowsheet data found.

## 2021-03-07 NOTE — TOC Progression Note (Signed)
Transition of Care [facility]) - Progression Note    Patient Details  Name: ANETRA CZERWINSKI MRN: [id number] Date of Birth: [birth year]  Transition of Care [facility]) CM/SW Contact  Lorri Frederick, LCSW Phone Number: 03/07/2021, 8:44 AM  Clinical Narrative:   Berkley Harvey approved in [employer]: Plan Auth ID: [id number], ref# [id number]  5 days: 12/2-12/6    Expected Discharge Plan: Skilled Nursing Facility Barriers to Discharge: Continued Medical Work up, SNF Pending bed offer  Expected Discharge Plan and Services Expected Discharge Plan: Skilled Nursing Facility In-house Referral: Clinical Social Work   Post Acute Care Choice: Skilled Nursing Facility Living arrangements for the past 2 months: Single Family Home Expected Discharge Date: 03/06/21                                     Social Determinants of Health (SDOH) Interventions    Readmission Risk Interventions No flowsheet data found.

## 2021-03-07 NOTE — Progress Notes (Signed)
Physical Therapy Treatment Patient Details Name: Alexis Cox MRN: [id number] DOB: 1945/08/07 Today's Date: 03/07/2021   History of Present Illness Alexis Cox is an 75 y.o. female admitted 11/29 after a mechanical fall and was found to have a right femoral neck fracture.  Pt underwent right THA direct anterior approach on 11/29.  PMH:  impaired glucose intolerance, fatty liver    PT Comments    Pt admitted with above diagnosis. Pt was able to ambulate with RW with cues for sequencing and to stay close to RW.  Pt also able to perform exercises. Pt with poor endurance for activity. Plans to go to SNF today.  Pt currently with functional limitations due to balance and endurance deficits. Pt will benefit from skilled PT to increase their independence and safety with mobility to allow discharge to the venue listed below.      Recommendations for follow up therapy are one component of a multi-disciplinary discharge planning process, led by the attending physician.  Recommendations may be updated based on patient status, additional functional criteria and insurance authorization.  Follow Up Recommendations  Follow physician's recommendations for discharge plan and follow up therapies (Husband cant assist and pt currently mod assist therefore would benefit from SNF with therapy)     Assistance Recommended at Discharge Frequent or constant Supervision/Assistance  Equipment Recommendations  None recommended by PT    Recommendations for Other Services       Precautions / Restrictions Precautions Precautions: Fall Restrictions RLE Weight Bearing: Weight bearing as tolerated     Mobility  Bed Mobility Overal bed mobility: Needs Assistance Bed Mobility: Supine to Sit     Supine to sit: Min assist     General bed mobility comments: in chair on arrival    Transfers Overall transfer level: Needs assistance Equipment used: Rolling walker (2 wheels) Transfers: Sit to/from  Stand Sit to Stand: Mod assist;Min assist           General transfer comment: Needed min to  mod assist to power up from bed    Ambulation/Gait Ambulation/Gait assistance: Min assist;Min guard Gait Distance (Feet): 30 Feet Assistive device: Rolling walker (2 wheels) Gait Pattern/deviations: Step-to pattern;Decreased step length - right;Decreased stance time - right;Decreased weight shift to right;Antalgic   Gait velocity interpretation: <1.8 ft/sec, indicate of risk for recurrent falls   General Gait Details: Pt was able to ambulate with RW with min to min guard assist with cues for sequencing steps and RW as well as to stay in proximity to RW. Pt with antalgic gait and having difficulty putting weight ont he right LE.  Pt did report a little dizziness toward end of walk. O2 91% on RA at end of session.   Stairs             Wheelchair Mobility    Modified Rankin (Stroke Patients Only)       Balance Overall balance assessment: Needs assistance Sitting-balance support: No upper extremity supported;Feet supported Sitting balance-Leahy Scale: Fair     Standing balance support: Bilateral upper extremity supported;During functional activity Standing balance-Leahy Scale: Poor Standing balance comment: Needed RW and external support for balance                            Cognition Arousal/Alertness: Awake/alert Behavior During Therapy: WFL for tasks assessed/performed Overall Cognitive Status: Within Functional Limits for tasks assessed  Exercises Total Joint Exercises Ankle Circles/Pumps: AROM;Both;10 reps;Supine Quad Sets: AROM;Both;10 reps;Supine Heel Slides: AROM;Right;10 reps;Supine Hip ABduction/ADduction: AROM;Right;10 reps;Supine    General Comments        Pertinent Vitals/Pain Pain Assessment: Faces Faces Pain Scale: Hurts little more Pain Location: right LE Pain Descriptors /  Indicators: Aching;Grimacing;Guarding Pain Intervention(s): Limited activity within patient's tolerance;Monitored during session;Repositioned    Home Living                          Prior Function            PT Goals (current goals can now be found in the care plan section) Acute Rehab PT Goals Patient Stated Goal: to go home Progress towards PT goals: Progressing toward goals    Frequency    7X/week      PT Plan Current plan remains appropriate    Co-evaluation              AM-PAC PT "6 Clicks" Mobility   Outcome Measure  Help needed turning from your back to your side while in a flat bed without using bedrails?: A Little Help needed moving from lying on your back to sitting on the side of a flat bed without using bedrails?: A Lot Help needed moving to and from a bed to a chair (including a wheelchair)?: A Lot Help needed standing up from a chair using your arms (e.g., wheelchair or bedside chair)?: A Lot Help needed to walk in hospital room?: A Little Help needed climbing 3-5 steps with a railing? : A Lot 6 Click Score: 14    End of Session Equipment Utilized During Treatment: Gait belt Activity Tolerance: Patient limited by fatigue Patient left: with call bell/phone within reach;in chair;with chair alarm set Nurse Communication: Mobility status PT Visit Diagnosis: Unsteadiness on feet (R26.81);Muscle weakness (generalized) (M62.81);Pain Pain - Right/Left: Right Pain - part of body: Hip     Time: 1131-1145 PT Time Calculation (min) (ACUTE ONLY): 14 min  Charges:  $Gait Training: 8-22 mins                     Margi Edmundson M,PT Acute Rehab Services [phone] (pager)    [name] 03/07/2021, 3:31 PM

## 2021-03-10 DIAGNOSIS — R262 Difficulty in walking, not elsewhere classified: Secondary | ICD-10-CM | POA: Diagnosis not present

## 2021-03-10 DIAGNOSIS — K760 Fatty (change of) liver, not elsewhere classified: Secondary | ICD-10-CM | POA: Diagnosis not present

## 2021-03-10 DIAGNOSIS — Z9889 Other specified postprocedural states: Secondary | ICD-10-CM | POA: Diagnosis not present

## 2021-03-18 ENCOUNTER — Telehealth: Payer: Self-pay

## 2021-03-18 DIAGNOSIS — R262 Difficulty in walking, not elsewhere classified: Secondary | ICD-10-CM | POA: Diagnosis not present

## 2021-03-18 DIAGNOSIS — Z9889 Other specified postprocedural states: Secondary | ICD-10-CM | POA: Diagnosis not present

## 2021-03-18 DIAGNOSIS — I700 Atherosclerosis of aorta: Secondary | ICD-10-CM | POA: Diagnosis not present

## 2021-03-18 DIAGNOSIS — I5030 Unspecified diastolic (congestive) heart failure: Secondary | ICD-10-CM | POA: Diagnosis not present

## 2021-03-18 NOTE — Telephone Encounter (Signed)
Baird Lyons aware that it doesn't necessarily have to be changed  No redness or drainage it's fine to leave on

## 2021-03-18 NOTE — Telephone Encounter (Signed)
Alexis Cox with [employer] in [place] would like to know if Aquacel dressing needs to stay on until postop appointment or taken off?  Cb# [phone].  Please advise.  Thank you.

## 2021-03-21 DIAGNOSIS — D649 Anemia, unspecified: Secondary | ICD-10-CM | POA: Diagnosis not present

## 2021-03-21 DIAGNOSIS — Z9889 Other specified postprocedural states: Secondary | ICD-10-CM | POA: Diagnosis not present

## 2021-03-21 DIAGNOSIS — R262 Difficulty in walking, not elsewhere classified: Secondary | ICD-10-CM | POA: Diagnosis not present

## 2021-03-26 ENCOUNTER — Telehealth: Payer: Self-pay | Admitting: Orthopaedic Surgery

## 2021-03-26 NOTE — Telephone Encounter (Signed)
Lyla Son from [facility] called. Patient declined home PT and OT. Carrier's number is [phone]

## 2021-03-27 ENCOUNTER — Other Ambulatory Visit: Payer: Self-pay

## 2021-03-27 ENCOUNTER — Ambulatory Visit (INDEPENDENT_AMBULATORY_CARE_PROVIDER_SITE_OTHER): Payer: Medicare Other | Admitting: Physician Assistant

## 2021-03-27 ENCOUNTER — Encounter: Payer: Self-pay | Admitting: Physician Assistant

## 2021-03-27 DIAGNOSIS — Z96641 Presence of right artificial hip joint: Secondary | ICD-10-CM

## 2021-03-27 NOTE — Progress Notes (Signed)
HPI: Alexis Cox 75 year old female who unfortunately was carrying groceries into her home when she had a fall and injured her right hip.  She underwent a right total hip arthroplasty for the right hip fracture on 03/04/2021.  She is discharged to the skilled facility.  She is overall doing well.  She presents today for follow-up with her daughter.  She reports she has had no pain medications.  She is having minimal discomfort.  She is unable to take Tylenol or ibuprofen.  She reports she is taking aspirin while in the hospital total and at a skilled facility but was not sent home on aspirin.   Review of systems: See HPI otherwise negative  Physical exam: Right hip surgical incisions healing well well approximated with staples no signs of infection.  No wound breakdown.  Right calf supple nontender dorsiflexion plantarflexion right ankle intact.  She gets on and off the exam table on her own.  Ambulates with a rolling walker.  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  Scar tissue mobilization encouraged.  Unfortunately she is unable to take any pain medicine outside at this point oxycodone.  Even tramadol she is unable to take.  Therefore it recommend heat ice to the area.  See her back in 1 month to see how she is doing overall.  Questions encouraged and answered.

## 2021-04-06 DIAGNOSIS — E119 Type 2 diabetes mellitus without complications: Secondary | ICD-10-CM | POA: Diagnosis not present

## 2021-04-14 ENCOUNTER — Encounter: Payer: Medicare Other | Admitting: Orthopaedic Surgery

## 2021-04-17 DIAGNOSIS — G4700 Insomnia, unspecified: Secondary | ICD-10-CM | POA: Diagnosis not present

## 2021-04-17 DIAGNOSIS — K7460 Unspecified cirrhosis of liver: Secondary | ICD-10-CM | POA: Diagnosis not present

## 2021-04-17 DIAGNOSIS — K219 Gastro-esophageal reflux disease without esophagitis: Secondary | ICD-10-CM | POA: Diagnosis not present

## 2021-04-17 DIAGNOSIS — H9193 Unspecified hearing loss, bilateral: Secondary | ICD-10-CM | POA: Diagnosis not present

## 2021-04-17 DIAGNOSIS — Z79899 Other long term (current) drug therapy: Secondary | ICD-10-CM | POA: Diagnosis not present

## 2021-04-17 DIAGNOSIS — E7800 Pure hypercholesterolemia, unspecified: Secondary | ICD-10-CM | POA: Diagnosis not present

## 2021-04-17 DIAGNOSIS — E1169 Type 2 diabetes mellitus with other specified complication: Secondary | ICD-10-CM | POA: Diagnosis not present

## 2021-04-17 DIAGNOSIS — N1831 Chronic kidney disease, stage 3a: Secondary | ICD-10-CM | POA: Diagnosis not present

## 2021-04-24 ENCOUNTER — Ambulatory Visit (INDEPENDENT_AMBULATORY_CARE_PROVIDER_SITE_OTHER): Payer: Medicare Other | Admitting: Physician Assistant

## 2021-04-24 ENCOUNTER — Other Ambulatory Visit: Payer: Self-pay

## 2021-04-24 ENCOUNTER — Encounter: Payer: Self-pay | Admitting: Physician Assistant

## 2021-04-24 DIAGNOSIS — Z96641 Presence of right artificial hip joint: Secondary | ICD-10-CM

## 2021-04-24 NOTE — Progress Notes (Signed)
HPI: Mrs. Deeg returns today follow-up status post right total hip arthroplasty 03/04/2021 secondary to hip fracture.  She is overall doing well.  She states she has no real pain in her hip with some stiffness.  She is taking no medications for the pain.  She does use a cane to support her gait balance when out of the home but no assistive devices inside the home.  No fevers chills.  Physical exam: Right hip excellent range of motion without pain.  Right calf supple nontender.  Dorsiflexion plantarflexion right ankle intact.  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  Follow-up with [place] in 6 months at that time AP pelvis and lateral view of the right hip.  She will follow-up sooner if there is any questions concerns.  Questions were encouraged and answered.

## 2021-05-07 DIAGNOSIS — E119 Type 2 diabetes mellitus without complications: Secondary | ICD-10-CM | POA: Diagnosis not present

## 2021-06-06 DIAGNOSIS — E119 Type 2 diabetes mellitus without complications: Secondary | ICD-10-CM | POA: Diagnosis not present

## 2021-06-16 DIAGNOSIS — E785 Hyperlipidemia, unspecified: Secondary | ICD-10-CM | POA: Diagnosis not present

## 2021-06-16 DIAGNOSIS — Z9181 History of falling: Secondary | ICD-10-CM | POA: Diagnosis not present

## 2021-06-16 DIAGNOSIS — Z0000 Encounter for general adult medical examination without abnormal findings: Secondary | ICD-10-CM | POA: Diagnosis not present

## 2021-07-16 DIAGNOSIS — H9193 Unspecified hearing loss, bilateral: Secondary | ICD-10-CM | POA: Diagnosis not present

## 2021-07-16 DIAGNOSIS — K7460 Unspecified cirrhosis of liver: Secondary | ICD-10-CM | POA: Diagnosis not present

## 2021-07-16 DIAGNOSIS — D649 Anemia, unspecified: Secondary | ICD-10-CM | POA: Diagnosis not present

## 2021-07-16 DIAGNOSIS — K219 Gastro-esophageal reflux disease without esophagitis: Secondary | ICD-10-CM | POA: Diagnosis not present

## 2021-07-16 DIAGNOSIS — L814 Other melanin hyperpigmentation: Secondary | ICD-10-CM | POA: Diagnosis not present

## 2021-07-16 DIAGNOSIS — G4700 Insomnia, unspecified: Secondary | ICD-10-CM | POA: Diagnosis not present

## 2021-07-16 DIAGNOSIS — E1169 Type 2 diabetes mellitus with other specified complication: Secondary | ICD-10-CM | POA: Diagnosis not present

## 2021-07-16 DIAGNOSIS — N1831 Chronic kidney disease, stage 3a: Secondary | ICD-10-CM | POA: Diagnosis not present

## 2021-07-16 DIAGNOSIS — N182 Chronic kidney disease, stage 2 (mild): Secondary | ICD-10-CM | POA: Diagnosis not present

## 2021-07-16 DIAGNOSIS — E7800 Pure hypercholesterolemia, unspecified: Secondary | ICD-10-CM | POA: Diagnosis not present

## 2021-08-05 DIAGNOSIS — E119 Type 2 diabetes mellitus without complications: Secondary | ICD-10-CM | POA: Diagnosis not present

## 2021-09-04 DIAGNOSIS — E119 Type 2 diabetes mellitus without complications: Secondary | ICD-10-CM | POA: Diagnosis not present

## 2021-10-04 DIAGNOSIS — E119 Type 2 diabetes mellitus without complications: Secondary | ICD-10-CM | POA: Diagnosis not present

## 2021-10-20 DIAGNOSIS — K219 Gastro-esophageal reflux disease without esophagitis: Secondary | ICD-10-CM | POA: Diagnosis not present

## 2021-10-20 DIAGNOSIS — N182 Chronic kidney disease, stage 2 (mild): Secondary | ICD-10-CM | POA: Diagnosis not present

## 2021-10-20 DIAGNOSIS — E7800 Pure hypercholesterolemia, unspecified: Secondary | ICD-10-CM | POA: Diagnosis not present

## 2021-10-20 DIAGNOSIS — G4700 Insomnia, unspecified: Secondary | ICD-10-CM | POA: Diagnosis not present

## 2021-10-20 DIAGNOSIS — K7460 Unspecified cirrhosis of liver: Secondary | ICD-10-CM | POA: Diagnosis not present

## 2021-10-20 DIAGNOSIS — E1169 Type 2 diabetes mellitus with other specified complication: Secondary | ICD-10-CM | POA: Diagnosis not present

## 2021-10-22 ENCOUNTER — Ambulatory Visit: Payer: Medicare Other | Admitting: Orthopaedic Surgery

## 2021-10-22 ENCOUNTER — Encounter: Payer: Self-pay | Admitting: Orthopaedic Surgery

## 2021-10-22 ENCOUNTER — Ambulatory Visit (INDEPENDENT_AMBULATORY_CARE_PROVIDER_SITE_OTHER): Payer: Medicare Other

## 2021-10-22 DIAGNOSIS — Z96641 Presence of right artificial hip joint: Secondary | ICD-10-CM

## 2021-10-22 MED ORDER — CELECOXIB 200 MG PO CAPS: 200.0000 mg | ORAL_CAPSULE | Freq: Two times a day (BID) | ORAL | 3 refills | Status: DC | PRN

## 2021-10-22 MED ORDER — METHOCARBAMOL 500 MG PO TABS: 500.0000 mg | ORAL_TABLET | Freq: Four times a day (QID) | ORAL | 1 refills | Status: DC | PRN

## 2021-10-22 NOTE — Progress Notes (Signed)
The patient is a 76 year old female who had a right total hip arthroplasty in late November as treatment for a displaced acute femoral neck fracture.  She sometimes feels like she is having pain in the groin and on the side of her hip but overall seems to be doing well.  She is walking without an assistive device.  Her right operative hip moves smoothly and fluidly with minimal pain.  Some of this is over the trochanteric area is likely some tendinitis and bursitis.  An AP pelvis today and a lateral of the right operative hip shows a well-seated total hip arthroplasty with bone ingrowth and no complicating features.  At this point we can try some Celebrex as an anti-inflammatory because she has had this in the past and occasional methocarbamol.  I will send this to her pharmacy.  All questions and concerns were answered and addressed.  Follow-up is as needed.

## 2021-11-04 DIAGNOSIS — E119 Type 2 diabetes mellitus without complications: Secondary | ICD-10-CM | POA: Diagnosis not present

## 2021-12-05 DIAGNOSIS — E119 Type 2 diabetes mellitus without complications: Secondary | ICD-10-CM | POA: Diagnosis not present

## 2022-01-04 DIAGNOSIS — E119 Type 2 diabetes mellitus without complications: Secondary | ICD-10-CM | POA: Diagnosis not present

## 2022-01-20 DIAGNOSIS — K7460 Unspecified cirrhosis of liver: Secondary | ICD-10-CM | POA: Diagnosis not present

## 2022-01-20 DIAGNOSIS — Z23 Encounter for immunization: Secondary | ICD-10-CM | POA: Diagnosis not present

## 2022-01-20 DIAGNOSIS — N182 Chronic kidney disease, stage 2 (mild): Secondary | ICD-10-CM | POA: Diagnosis not present

## 2022-01-20 DIAGNOSIS — E1169 Type 2 diabetes mellitus with other specified complication: Secondary | ICD-10-CM | POA: Diagnosis not present

## 2022-01-20 DIAGNOSIS — Z1231 Encounter for screening mammogram for malignant neoplasm of breast: Secondary | ICD-10-CM | POA: Diagnosis not present

## 2022-01-20 DIAGNOSIS — G4700 Insomnia, unspecified: Secondary | ICD-10-CM | POA: Diagnosis not present

## 2022-01-20 DIAGNOSIS — E7800 Pure hypercholesterolemia, unspecified: Secondary | ICD-10-CM | POA: Diagnosis not present

## 2022-01-20 DIAGNOSIS — K219 Gastro-esophageal reflux disease without esophagitis: Secondary | ICD-10-CM | POA: Diagnosis not present

## 2022-02-03 DIAGNOSIS — I10 Essential (primary) hypertension: Secondary | ICD-10-CM | POA: Diagnosis not present

## 2022-02-03 DIAGNOSIS — N182 Chronic kidney disease, stage 2 (mild): Secondary | ICD-10-CM | POA: Diagnosis not present

## 2022-02-04 DIAGNOSIS — E119 Type 2 diabetes mellitus without complications: Secondary | ICD-10-CM | POA: Diagnosis not present

## 2022-02-09 IMAGING — DX DG PORTABLE PELVIS
1 series · 1 of 1 positions shown · non-contrast
Comparison: Pelvis radiographs dated 1 day prior, intraoperative
hip radiographs obtained earlier the same day

CLINICAL DATA: Status post right hip replacement

EXAM:
PORTABLE PELVIS 1-2 VIEWS

[pelvis]
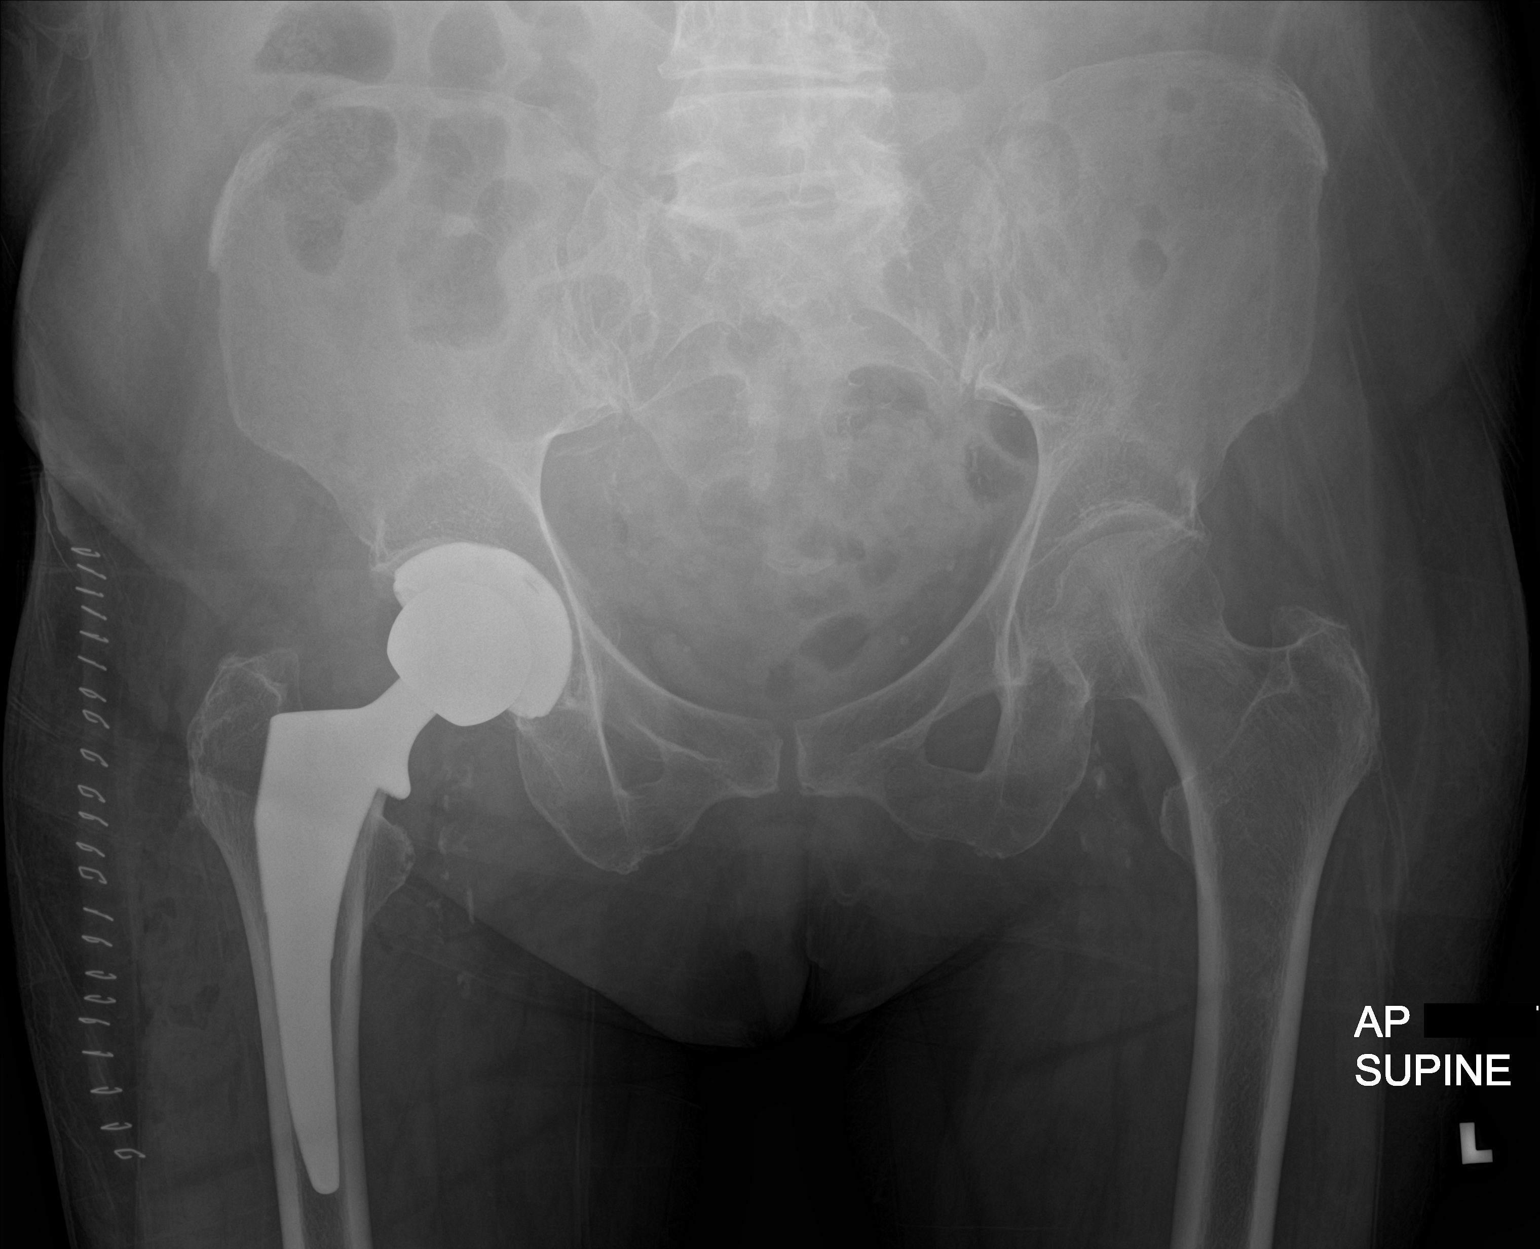

[1 of 1 positions shown; findings below may reference images not displayed]

FINDINGS: Postsurgical changes reflecting right hip arthroplasty are seen.
Hardware alignment is within expected limits, without evidence of
hardware related complication. There is expected postoperative soft
tissue gas around the right hip. Mild degenerative changes about the
left hip are unchanged. The SI joints and symphysis pubis are
intact.
IMPRESSION: Status post right hip replacement without evidence of complication.

## 2022-03-06 DIAGNOSIS — E119 Type 2 diabetes mellitus without complications: Secondary | ICD-10-CM | POA: Diagnosis not present

## 2022-04-05 DIAGNOSIS — E119 Type 2 diabetes mellitus without complications: Secondary | ICD-10-CM | POA: Diagnosis not present

## 2022-04-06 DIAGNOSIS — E119 Type 2 diabetes mellitus without complications: Secondary | ICD-10-CM | POA: Diagnosis not present

## 2022-04-30 DIAGNOSIS — E1169 Type 2 diabetes mellitus with other specified complication: Secondary | ICD-10-CM | POA: Diagnosis not present

## 2022-04-30 DIAGNOSIS — N182 Chronic kidney disease, stage 2 (mild): Secondary | ICD-10-CM | POA: Diagnosis not present

## 2022-04-30 DIAGNOSIS — G4700 Insomnia, unspecified: Secondary | ICD-10-CM | POA: Diagnosis not present

## 2022-04-30 DIAGNOSIS — K7460 Unspecified cirrhosis of liver: Secondary | ICD-10-CM | POA: Diagnosis not present

## 2022-04-30 DIAGNOSIS — E7800 Pure hypercholesterolemia, unspecified: Secondary | ICD-10-CM | POA: Diagnosis not present

## 2022-04-30 DIAGNOSIS — K219 Gastro-esophageal reflux disease without esophagitis: Secondary | ICD-10-CM | POA: Diagnosis not present

## 2022-04-30 DIAGNOSIS — Z139 Encounter for screening, unspecified: Secondary | ICD-10-CM | POA: Diagnosis not present

## 2022-05-07 DIAGNOSIS — E119 Type 2 diabetes mellitus without complications: Secondary | ICD-10-CM | POA: Diagnosis not present

## 2022-06-04 ENCOUNTER — Other Ambulatory Visit: Payer: Self-pay

## 2022-06-04 ENCOUNTER — Inpatient Hospital Stay (HOSPITAL_COMMUNITY): Payer: Medicare Other

## 2022-06-04 ENCOUNTER — Inpatient Hospital Stay (HOSPITAL_COMMUNITY)
Admission: EM | Admit: 2022-06-04 | Discharge: 2022-06-08 | DRG: 522 | Disposition: A | Discharge status: Skilled Nursing Facility | Payer: Medicare Other | Attending: Family Medicine | Admitting: Family Medicine

## 2022-06-04 ENCOUNTER — Emergency Department (HOSPITAL_COMMUNITY): Payer: Medicare Other

## 2022-06-04 ENCOUNTER — Encounter (HOSPITAL_COMMUNITY): Payer: Self-pay | Admitting: Family Medicine

## 2022-06-04 DIAGNOSIS — K760 Fatty (change of) liver, not elsewhere classified: Secondary | ICD-10-CM | POA: Diagnosis not present

## 2022-06-04 DIAGNOSIS — N182 Chronic kidney disease, stage 2 (mild): Secondary | ICD-10-CM | POA: Diagnosis not present

## 2022-06-04 DIAGNOSIS — K7469 Other cirrhosis of liver: Secondary | ICD-10-CM | POA: Diagnosis not present

## 2022-06-04 DIAGNOSIS — S72002D Fracture of unspecified part of neck of left femur, subsequent encounter for closed fracture with routine healing: Secondary | ICD-10-CM | POA: Diagnosis not present

## 2022-06-04 DIAGNOSIS — E119 Type 2 diabetes mellitus without complications: Secondary | ICD-10-CM | POA: Diagnosis not present

## 2022-06-04 DIAGNOSIS — D62 Acute posthemorrhagic anemia: Secondary | ICD-10-CM | POA: Diagnosis not present

## 2022-06-04 DIAGNOSIS — R6889 Other general symptoms and signs: Secondary | ICD-10-CM | POA: Diagnosis not present

## 2022-06-04 DIAGNOSIS — S7292XA Unspecified fracture of left femur, initial encounter for closed fracture: Secondary | ICD-10-CM | POA: Diagnosis not present

## 2022-06-04 DIAGNOSIS — I10 Essential (primary) hypertension: Secondary | ICD-10-CM | POA: Diagnosis not present

## 2022-06-04 DIAGNOSIS — I129 Hypertensive chronic kidney disease with stage 1 through stage 4 chronic kidney disease, or unspecified chronic kidney disease: Secondary | ICD-10-CM | POA: Diagnosis not present

## 2022-06-04 DIAGNOSIS — S72001A Fracture of unspecified part of neck of right femur, initial encounter for closed fracture: Secondary | ICD-10-CM | POA: Diagnosis present

## 2022-06-04 DIAGNOSIS — W010XXA Fall on same level from slipping, tripping and stumbling without subsequent striking against object, initial encounter: Secondary | ICD-10-CM | POA: Diagnosis present

## 2022-06-04 DIAGNOSIS — R29898 Other symptoms and signs involving the musculoskeletal system: Secondary | ICD-10-CM | POA: Diagnosis not present

## 2022-06-04 DIAGNOSIS — I8510 Secondary esophageal varices without bleeding: Secondary | ICD-10-CM | POA: Diagnosis not present

## 2022-06-04 DIAGNOSIS — Z7401 Bed confinement status: Secondary | ICD-10-CM | POA: Diagnosis not present

## 2022-06-04 DIAGNOSIS — D61818 Other pancytopenia: Secondary | ICD-10-CM | POA: Diagnosis present

## 2022-06-04 DIAGNOSIS — F419 Anxiety disorder, unspecified: Secondary | ICD-10-CM | POA: Diagnosis present

## 2022-06-04 DIAGNOSIS — N179 Acute kidney failure, unspecified: Secondary | ICD-10-CM | POA: Diagnosis not present

## 2022-06-04 DIAGNOSIS — S79912A Unspecified injury of left hip, initial encounter: Secondary | ICD-10-CM | POA: Diagnosis not present

## 2022-06-04 DIAGNOSIS — Z96641 Presence of right artificial hip joint: Secondary | ICD-10-CM | POA: Diagnosis not present

## 2022-06-04 DIAGNOSIS — F418 Other specified anxiety disorders: Secondary | ICD-10-CM | POA: Diagnosis not present

## 2022-06-04 DIAGNOSIS — I9581 Postprocedural hypotension: Secondary | ICD-10-CM | POA: Diagnosis not present

## 2022-06-04 DIAGNOSIS — Z79899 Other long term (current) drug therapy: Secondary | ICD-10-CM | POA: Diagnosis not present

## 2022-06-04 DIAGNOSIS — M6281 Muscle weakness (generalized): Secondary | ICD-10-CM | POA: Diagnosis not present

## 2022-06-04 DIAGNOSIS — S72042A Displaced fracture of base of neck of left femur, initial encounter for closed fracture: Secondary | ICD-10-CM | POA: Diagnosis not present

## 2022-06-04 DIAGNOSIS — S72012A Unspecified intracapsular fracture of left femur, initial encounter for closed fracture: Principal | ICD-10-CM | POA: Diagnosis present

## 2022-06-04 DIAGNOSIS — S72002A Fracture of unspecified part of neck of left femur, initial encounter for closed fracture: Secondary | ICD-10-CM | POA: Diagnosis present

## 2022-06-04 DIAGNOSIS — E1122 Type 2 diabetes mellitus with diabetic chronic kidney disease: Secondary | ICD-10-CM | POA: Diagnosis not present

## 2022-06-04 DIAGNOSIS — Y92512 Supermarket, store or market as the place of occurrence of the external cause: Secondary | ICD-10-CM

## 2022-06-04 DIAGNOSIS — R2689 Other abnormalities of gait and mobility: Secondary | ICD-10-CM | POA: Diagnosis not present

## 2022-06-04 DIAGNOSIS — K219 Gastro-esophageal reflux disease without esophagitis: Secondary | ICD-10-CM | POA: Diagnosis not present

## 2022-06-04 DIAGNOSIS — Z96642 Presence of left artificial hip joint: Secondary | ICD-10-CM | POA: Diagnosis not present

## 2022-06-04 DIAGNOSIS — Z743 Need for continuous supervision: Secondary | ICD-10-CM | POA: Diagnosis not present

## 2022-06-04 DIAGNOSIS — Z471 Aftercare following joint replacement surgery: Secondary | ICD-10-CM | POA: Diagnosis not present

## 2022-06-04 DIAGNOSIS — F32A Depression, unspecified: Secondary | ICD-10-CM | POA: Diagnosis present

## 2022-06-04 DIAGNOSIS — W19XXXA Unspecified fall, initial encounter: Secondary | ICD-10-CM

## 2022-06-04 DIAGNOSIS — Z4789 Encounter for other orthopedic aftercare: Secondary | ICD-10-CM | POA: Diagnosis not present

## 2022-06-04 DIAGNOSIS — Z9181 History of falling: Secondary | ICD-10-CM | POA: Diagnosis not present

## 2022-06-04 DIAGNOSIS — E785 Hyperlipidemia, unspecified: Secondary | ICD-10-CM | POA: Diagnosis present

## 2022-06-04 DIAGNOSIS — D649 Anemia, unspecified: Secondary | ICD-10-CM

## 2022-06-04 DIAGNOSIS — R0902 Hypoxemia: Secondary | ICD-10-CM | POA: Diagnosis not present

## 2022-06-04 DIAGNOSIS — I499 Cardiac arrhythmia, unspecified: Secondary | ICD-10-CM | POA: Diagnosis not present

## 2022-06-04 DIAGNOSIS — M79605 Pain in left leg: Secondary | ICD-10-CM | POA: Diagnosis present

## 2022-06-04 LAB — CBC
HCT: 33.1 % — ABNORMAL LOW (ref 36.0–46.0)
Hemoglobin: 9.9 g/dL — ABNORMAL LOW (ref 12.0–15.0)
MCH: 25.8 pg — ABNORMAL LOW (ref 26.0–34.0)
MCHC: 29.9 g/dL — ABNORMAL LOW (ref 30.0–36.0)
MCV: 86.4 fL (ref 80.0–100.0)
Platelets: 203 10*3/uL (ref 150–400)
RBC: 3.83 MIL/uL — ABNORMAL LOW (ref 3.87–5.11)
RDW: 16.9 % — ABNORMAL HIGH (ref 11.5–15.5)
WBC: 8.1 10*3/uL (ref 4.0–10.5)
nRBC: 0 % (ref 0.0–0.2)

## 2022-06-04 LAB — IRON AND TIBC
Iron: 69 ug/dL (ref 28–170)
Saturation Ratios: 17 % (ref 10.4–31.8)
TIBC: 403 ug/dL (ref 250–450)
UIBC: 334 ug/dL

## 2022-06-04 LAB — SURGICAL PCR SCREEN
MRSA, PCR: NEGATIVE
Staphylococcus aureus: NEGATIVE

## 2022-06-04 LAB — CBG MONITORING, ED: Glucose-Capillary: 128 mg/dL — ABNORMAL HIGH (ref 70–99)

## 2022-06-04 LAB — BASIC METABOLIC PANEL
Anion gap: 13 (ref 5–15)
BUN: 12 mg/dL (ref 8–23)
CO2: 22 mmol/L (ref 22–32)
Calcium: 8.6 mg/dL — ABNORMAL LOW (ref 8.9–10.3)
Chloride: 103 mmol/L (ref 98–111)
Creatinine, Ser: 0.85 mg/dL (ref 0.44–1.00)
GFR, Estimated: >60 mL/min (ref >60)
Glucose, Bld: 137 mg/dL — ABNORMAL HIGH (ref 70–99)
Potassium: 3.8 mmol/L (ref 3.5–5.1)
Sodium: 138 mmol/L (ref 135–145)

## 2022-06-04 LAB — PROTIME-INR
INR: 1.2 (ref 0.8–1.2)
Prothrombin Time: 15.3 seconds — ABNORMAL HIGH (ref 11.4–15.2)

## 2022-06-04 LAB — HEPATIC FUNCTION PANEL
ALT: 20 U/L (ref 0–44)
AST: 27 U/L (ref 15–41)
Albumin: 3.5 g/dL (ref 3.5–5.0)
Alkaline Phosphatase: 85 U/L (ref 38–126)
Bilirubin, Direct: 0.4 mg/dL — ABNORMAL HIGH (ref 0.0–0.2)
Indirect Bilirubin: 1 mg/dL — ABNORMAL HIGH (ref 0.3–0.9)
Total Bilirubin: 1.4 mg/dL — ABNORMAL HIGH (ref 0.3–1.2)
Total Protein: 6.8 g/dL (ref 6.5–8.1)

## 2022-06-04 LAB — FERRITIN: Ferritin: 23 ng/mL (ref 11–307)

## 2022-06-04 MED ORDER — SENNOSIDES-DOCUSATE SODIUM 8.6-50 MG PO TABS: 1.0000 | ORAL_TABLET | Freq: Every evening | ORAL | Status: DC | PRN

## 2022-06-04 MED ORDER — PANTOPRAZOLE SODIUM 40 MG PO TBEC
40.0000 mg | DELAYED_RELEASE_TABLET | Freq: Two times a day (BID) | ORAL | Status: DC
  Administered 2022-06-04 – 2022-06-08 (×6): 40 mg via ORAL
  Filled 2022-06-04 (×6): qty 1

## 2022-06-04 MED ORDER — FLUOXETINE HCL 20 MG PO CAPS
20.0000 mg | ORAL_CAPSULE | Freq: Every day | ORAL | Status: DC
  Administered 2022-06-04 – 2022-06-08 (×4): 20 mg via ORAL
  Filled 2022-06-04 (×4): qty 1

## 2022-06-04 MED ORDER — HYDROCODONE-ACETAMINOPHEN 5-325 MG PO TABS
1.0000 | ORAL_TABLET | Freq: Once | ORAL | Status: AC
  Administered 2022-06-04: 1 via ORAL
  Filled 2022-06-04: qty 1

## 2022-06-04 MED ORDER — INSULIN ASPART 100 UNIT/ML IJ SOLN
0.0000 [IU] | Freq: Three times a day (TID) | INTRAMUSCULAR | Status: DC
  Administered 2022-06-05 – 2022-06-07 (×2): 1 [IU] via SUBCUTANEOUS
  Administered 2022-06-07: 2 [IU] via SUBCUTANEOUS

## 2022-06-04 MED ORDER — METHOCARBAMOL 500 MG PO TABS
500.0000 mg | ORAL_TABLET | Freq: Four times a day (QID) | ORAL | Status: DC | PRN
  Administered 2022-06-05 – 2022-06-07 (×3): 500 mg via ORAL
  Filled 2022-06-04: qty 1

## 2022-06-04 MED ORDER — HYDROCODONE-ACETAMINOPHEN 5-325 MG PO TABS
1.0000 | ORAL_TABLET | Freq: Four times a day (QID) | ORAL | Status: DC | PRN
  Administered 2022-06-05: 1 via ORAL
  Filled 2022-06-04 (×2): qty 1

## 2022-06-04 MED ORDER — MORPHINE SULFATE (PF) 2 MG/ML IV SOLN
0.5000 mg | INTRAVENOUS | Status: DC | PRN
  Administered 2022-06-04 – 2022-06-05 (×4): 0.5 mg via INTRAVENOUS
  Filled 2022-06-04 (×4): qty 1

## 2022-06-04 MED ORDER — METHOCARBAMOL 1000 MG/10ML IJ SOLN
500.0000 mg | Freq: Four times a day (QID) | INTRAVENOUS | Status: DC | PRN
  Filled 2022-06-04: qty 5

## 2022-06-04 MED ORDER — EZETIMIBE 10 MG PO TABS
10.0000 mg | ORAL_TABLET | Freq: Every day | ORAL | Status: DC
  Administered 2022-06-06 – 2022-06-08 (×3): 10 mg via ORAL
  Filled 2022-06-04 (×3): qty 1

## 2022-06-04 MED ORDER — PROPRANOLOL HCL 20 MG PO TABS
10.0000 mg | ORAL_TABLET | Freq: Two times a day (BID) | ORAL | Status: DC
  Administered 2022-06-04 – 2022-06-06 (×3): 10 mg via ORAL
  Filled 2022-06-04 (×3): qty 1

## 2022-06-04 NOTE — ED Provider Notes (Signed)
[facility] Provider Note   CSN: [id number] Arrival date & time: 06/04/22  1250     History  Chief Complaint  Patient presents with   Alexis Cox is a 77 y.o. female.  HPI 77 year old female with history of hypertension, cirrhosis and depression presents to the ER after a fall.  She was at [facility] where she tripped over a piece of furniture and fell onto her left hip.  EMS noted some shortening of her left leg.  She denies hitting her head or losing consciousness.  She had 2 episodes of emesis with EMS but states that this is not uncommon for her when she is stressed out.  Denies any numbness or tingling.  States that she recently had fallen and broken her right hip and had a replacement for that.  She is not on anticoagulation.    Home Medications Prior to Admission medications   Medication Sig Start Date End Date Taking? Authorizing Provider  celecoxib (CELEBREX) 200 MG capsule Take 1 capsule (200 mg total) by mouth 2 (two) times daily between meals as needed. 10/22/21   Mcarthur Rossetti, MD  ezetimibe (ZETIA) 10 MG tablet Take 10 mg by mouth daily.    [provider]  FLUoxetine (PROZAC) 20 MG tablet Take 20 mg by mouth daily.    [provider]  methocarbamol (ROBAXIN) 500 MG tablet Take 1 tablet (500 mg total) by mouth every 6 (six) hours as needed. 10/22/21   Mcarthur Rossetti, MD  Multiple Vitamin (MULTI VITAMIN DAILY PO) Take 2 tablets by mouth daily. gummy    [provider]  omeprazole (PRILOSEC) 20 MG capsule Take 20 mg by mouth 2 (two) times daily before a meal.    [provider]  propranolol (INDERAL) 10 MG tablet Take 10 mg by mouth 2 (two) times daily.    [provider]  traZODone (DESYREL) 50 MG tablet Take 50 mg by mouth at bedtime.    [provider]      Allergies    Patient has no known allergies.    Review of Systems   Review of  Systems Ten systems reviewed and are negative for acute change, except as noted in the HPI.   Physical Exam Updated Vital Signs BP 124/78 (BP Location: Right Arm)   Pulse 87   Temp 97.8 F (36.6 C) (Oral)   Resp 16   SpO2 95%  Physical Exam Vitals and nursing note reviewed.  Constitutional:      General: She is not in acute distress.    Appearance: She is well-developed.  HENT:     Head: Normocephalic and atraumatic.  Eyes:     Conjunctiva/sclera: Conjunctivae normal.  Cardiovascular:     Rate and Rhythm: Normal rate and regular rhythm.     Heart sounds: No murmur heard. Pulmonary:     Effort: Pulmonary effort is normal. No respiratory distress.     Breath sounds: Normal breath sounds.  Abdominal:     Palpations: Abdomen is soft.     Tenderness: There is no abdominal tenderness.  Musculoskeletal:        General: Tenderness and signs of injury present. No swelling.     Cervical back: Neck supple.     Comments: Shortening of the left leg with tenderness to palpation to the left lateral hip.  2+ DP pulses.  Sensations intact.  No evidence of open fractures.  Skin:  General: Skin is warm and dry.     Capillary Refill: Capillary refill takes less than 2 seconds.  Neurological:     Mental Status: She is alert.  Psychiatric:        Mood and Affect: Mood normal.     ED Results / Procedures / Treatments   Labs (all labs ordered are listed, but only abnormal results are displayed) Labs Reviewed  CBC - Abnormal; Notable for the following components:      Result Value   RBC 3.83 (*)    Hemoglobin 9.9 (*)    HCT 33.1 (*)    MCH 25.8 (*)    MCHC 29.9 (*)    RDW 16.9 (*)    All other components within normal limits  BASIC METABOLIC PANEL - Abnormal; Notable for the following components:   Glucose, Bld 137 (*)    Calcium 8.6 (*)    All other components within normal limits    EKG None  Radiology DG Hip Unilat W or Wo Pelvis 2-3 Views Left  Result Date:  06/04/2022 CLINICAL DATA:  Mechanical fall with pain to the left hip EXAM: DG HIP (WITH OR WITHOUT PELVIS) 2V LEFT COMPARISON:  Pelvis radiograph dated 03/04/2021 FINDINGS: Transcervical left femoral fracture with foreshortening of the left femur and apex lateral angulation. The femoroacetabular joint remains intact. Postsurgical changes from right hip arthroplasty. Hardware appears intact. IMPRESSION: Transcervical left femoral fracture with foreshortening and apex lateral angulation. Electronically Signed   By: Darrin Nipper M.D.   On: 06/04/2022 13:36    Procedures Procedures    Medications Ordered in ED Medications  HYDROcodone-acetaminophen (NORCO/VICODIN) 5-325 MG per tablet 1 tablet (1 tablet Oral Given 06/04/22 1427)    ED Course/ Medical Decision Making/ A&P                             Medical Decision Making Amount and/or Complexity of Data Reviewed Labs: ordered. Radiology: ordered.  Risk Prescription drug management. Decision regarding hospitalization.   77 year old female presents to the ER after a fall.  Visible left leg shortening.  X-ray ordered, reviewed, agree with radiology read, transcervical left femoral fracture noted.  Labs ordered, reviewed, BMP largely unremarkable, hemoglobin stable.  She was given Norco for pain.  Orion Crook with orthopedics evaluated the patient, plan for admission with the hospitalist team at [facility] long with intent to go to the OR tomorrow.  Consulted hospitalist team. Final Clinical Impression(s) / ED Diagnoses Final diagnoses:  Fall, initial encounter  Closed fracture of left femur, unspecified fracture morphology, unspecified portion of femur, initial encounter [facility])    Rx / [place] Orders ED Discharge Orders     None         Garald Balding, PA-C 06/04/22 Crockett, [place], DO 06/05/22 [phone]

## 2022-06-04 NOTE — Assessment & Plan Note (Signed)
Baseline around 10,stable Check iron studies No signs of bleeding Trend

## 2022-06-04 NOTE — Assessment & Plan Note (Signed)
Stable, continue to monitor  ?

## 2022-06-04 NOTE — ED Triage Notes (Addendum)
Pt BIB [facility] from [place] where pt tripped over and fell, landed on L side hip. Pt was sitting on the bench. Pt has shortening outwards on L hip. Strong pulse. Did not hit her head, no LOC. Pt vomited twice which pt stated is normal for her whenever she gets nervous. A&O X4.   100 mcg Fentanyl given

## 2022-06-04 NOTE — Assessment & Plan Note (Signed)
Continue prozac '20mg'$  daily

## 2022-06-04 NOTE — Assessment & Plan Note (Signed)
Well controlled continue inderal

## 2022-06-04 NOTE — Assessment & Plan Note (Addendum)
77 year old presenting with fall and left hip pain found to have transcervical left femoral fracture and foreshortening and apex lateral angulation.  -admit to med-surg at [place] consulted with plan for surgery tomorrow (Dr. 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

## 2022-06-04 NOTE — Consult Note (Signed)
Reason for Consult:Left hip fx Referring Physician: Leanord Asal Time called: 1406 Time at bedside: [place] is an 77 y.o. female.  HPI: Alexis Cox was at the [facility] today when something fell on her and knocked her down. She had immediate left hip pain but managed to get up with help. It was very painful though and EMS was called and she was brought to the ED. X-rays showed a hip fx and orthopedic surgery was consulted.   Past Medical History:  Diagnosis Date   Cirrhosis ([place])    Depression    Hypertension     Past Surgical History:  Procedure Laterality Date   ABDOMINAL HYSTERECTOMY     APPENDECTOMY     TONSILLECTOMY     TOTAL HIP ARTHROPLASTY Right 03/04/2021   Procedure: TOTAL HIP ARTHROPLASTY ANTERIOR APPROACH;  Surgeon: Mcarthur Rossetti, MD;  Location: [facility];  Service: Orthopedics;  Laterality: Right;    No family history on file.  Social History:  reports that she has never smoked. She has never used smokeless tobacco. She reports that she does not drink alcohol and does not use drugs.  Allergies: No Known Allergies  Medications: I have reviewed the patient's current medications.  Results for orders placed or performed during the hospital encounter of 06/04/22 (from the past 48 hour(s))  CBC     Status: Abnormal   Collection Time: 06/04/22  1:00 PM  Result Value Ref Range   WBC 8.1 4.0 - 10.5 K/uL   RBC 3.83 (L) 3.87 - 5.11 MIL/uL   Hemoglobin 9.9 (L) 12.0 - 15.0 g/dL   HCT 33.1 (L) 36.0 - 46.0 %   MCV 86.4 80.0 - 100.0 fL   MCH 25.8 (L) 26.0 - 34.0 pg   MCHC 29.9 (L) 30.0 - 36.0 g/dL   RDW 16.9 (H) 11.5 - 15.5 %   Platelets 203 150 - 400 K/uL   nRBC 0.0 0.0 - 0.2 %    Comment: Performed at [facility], [address], [place], [place] [id number]  Basic metabolic panel     Status: Abnormal   Collection Time: 06/04/22  1:00 PM  Result Value Ref Range   Sodium 138 135 - 145 mmol/L   Potassium 3.8 3.5 - 5.1 mmol/L    Chloride 103 98 - 111 mmol/L   CO2 22 22 - 32 mmol/L   Glucose, Bld 137 (H) 70 - 99 mg/dL    Comment: Glucose reference range applies only to samples taken after fasting for at least 8 hours.   BUN 12 8 - 23 mg/dL   Creatinine, Ser 0.85 0.44 - 1.00 mg/dL   Calcium 8.6 (L) 8.9 - 10.3 mg/dL   GFR, Estimated >60 >60 mL/min    Comment: (NOTE) Calculated using the CKD-EPI Creatinine Equation (2021)    Anion gap 13 5 - 15    Comment: Performed at [facility] [address], [place], [place] [address]    DG Hip Malvin Johns or Wo Pelvis 2-3 Views Left  Result Date: 06/04/2022 CLINICAL DATA:  Mechanical fall with pain to the left hip EXAM: DG HIP (WITH OR WITHOUT PELVIS) 2V LEFT COMPARISON:  Pelvis radiograph dated 03/04/2021 FINDINGS: Transcervical left femoral fracture with foreshortening of the left femur and apex lateral angulation. The femoroacetabular joint remains intact. Postsurgical changes from right hip arthroplasty. Hardware appears intact. IMPRESSION: Transcervical left femoral fracture with foreshortening and apex lateral angulation. Electronically Signed   By: Britt Bottom  Xu M.D.   On: 06/04/2022 13:36    Review of Systems  HENT:  Negative for ear discharge, ear pain, hearing loss and tinnitus.   Eyes:  Negative for photophobia and pain.  Respiratory:  Negative for cough and shortness of breath.   Cardiovascular:  Negative for chest pain.  Gastrointestinal:  Negative for abdominal pain, nausea and vomiting.  Genitourinary:  Negative for dysuria, flank pain, frequency and urgency.  Musculoskeletal:  Positive for arthralgias (Left hip). Negative for back pain, myalgias and neck pain.  Neurological:  Negative for dizziness and headaches.  Hematological:  Does not bruise/bleed easily.  Psychiatric/Behavioral:  The patient is not nervous/anxious.    Blood pressure 124/78, pulse 87, temperature 97.8 F (36.6 C), temperature source Oral, resp. rate 16, SpO2 95 %. Physical  Exam Constitutional:      General: She is not in acute distress.    Appearance: She is well-developed. She is not diaphoretic.  HENT:     Head: Normocephalic and atraumatic.  Eyes:     General: No scleral icterus.       Right eye: No discharge.        Left eye: No discharge.     Conjunctiva/sclera: Conjunctivae normal.  Cardiovascular:     Rate and Rhythm: Normal rate and regular rhythm.  Pulmonary:     Effort: Pulmonary effort is normal. No respiratory distress.  Musculoskeletal:     Cervical back: Normal range of motion.     Comments: LLE No traumatic wounds, ecchymosis, or rash  Mod TTP hip  No knee or ankle effusion  Knee stable to varus/ valgus and anterior/posterior stress  Sens DPN, SPN, TN intact  Motor EHL, ext, flex, evers 5/5  DP 2+, PT 2+, No significant edema  Skin:    General: Skin is warm and dry.  Neurological:     Mental Status: She is alert.  Psychiatric:        Mood and Affect: Mood normal.        Behavior: Behavior normal.     Assessment/Plan: Left hip fx -- Plan THA tomorrow with Dr. Ninfa Linden at [facility]. Please keep NPO after MN.    Lisette Abu, PA-C Orthopedic Surgery [phone] 06/04/2022, 2:33 PM

## 2022-06-04 NOTE — ED Notes (Signed)
Patient's pulse ox was 79% on room air. Paramedic made aware after giving report. Patient placed onto 2L. Sats are now 95 %. RN aware.

## 2022-06-04 NOTE — H&P (Signed)
History and Physical    Patient: Alexis Cox [id number] DOB: October 04, 1945 DOA: 06/04/2022 DOS: the patient was seen and examined on 06/04/2022 PCP: Leonides Sake, MD  Patient coming [facility]:  [facility]  - lives with her husband, son and his family. Ambulates independently.    Chief Complaint: fall with left hip pain.   HPI: Alexis Cox is a 77 y.o. female with medical history significant of HTN,cirrhosis, GERD, HLD, CKD stage 2, T2DM and depression who presented to ED after a fall.  She was at [place] and tripped over some furniture onto her left hip. She said a head board fell into her and she fell onto her left hip.  She had immediate pain and could not bear weight.  She had her right hip replaced 03/03/21 after a fall and fracture.    Denies any fever/chills, vision changes/headaches, chest pain or palpitations, shortness of breath or cough, abdominal pain, (from baseline)  N/V, has chronic diarrhea, dysuria or leg swelling.   Does not smoke or drink.   ER Course:  vitals: afebrile, bp: 124/78, HR: 87, RR: 16, oxygen: 95% [place] Pertinent labs: hgb: 9.9,  Left hip xray: Transcervical left femoral fracture with foreshortening and apex lateral angulation. In ED: ortho consulted, TRH asked to admit.    Review of Systems: As mentioned in the history of present illness. All other systems reviewed and are negative. Past Medical History:  Diagnosis Date   Cirrhosis ([place])    Depression    Hypertension    Past Surgical History:  Procedure Laterality Date   ABDOMINAL HYSTERECTOMY     APPENDECTOMY     TONSILLECTOMY     TOTAL HIP ARTHROPLASTY Right 03/04/2021   Procedure: TOTAL HIP ARTHROPLASTY ANTERIOR APPROACH;  Surgeon: Mcarthur Rossetti, MD;  Location: [facility];  Service: Orthopedics;  Laterality: Right;   Social History:  reports that she has never smoked. She has never used smokeless tobacco. She reports that she does not drink alcohol and  does not use drugs.  No Known Allergies  No family history on file.  Prior to Admission medications   Medication Sig Start Date End Date Taking? Authorizing Provider  celecoxib (CELEBREX) 200 MG capsule Take 1 capsule (200 mg total) by mouth 2 (two) times daily between meals as needed. 10/22/21   Mcarthur Rossetti, MD  ezetimibe (ZETIA) 10 MG tablet Take 10 mg by mouth daily.    [provider]  FLUoxetine (PROZAC) 20 MG tablet Take 20 mg by mouth daily.    [provider]  methocarbamol (ROBAXIN) 500 MG tablet Take 1 tablet (500 mg total) by mouth every 6 (six) hours as needed. 10/22/21   Mcarthur Rossetti, MD  Multiple Vitamin (MULTI VITAMIN DAILY PO) Take 2 tablets by mouth daily. gummy    [provider]  omeprazole (PRILOSEC) 20 MG capsule Take 20 mg by mouth 2 (two) times daily before a meal.    [provider]  propranolol (INDERAL) 10 MG tablet Take 10 mg by mouth 2 (two) times daily.    [provider]  traZODone (DESYREL) 50 MG tablet Take 50 mg by mouth at bedtime.    [provider]    Physical Exam: Vitals:   06/04/22 1251  BP: 124/78  Pulse: 87  Resp: 16  Temp: 97.8 F (36.6 C)  TempSrc: Oral  SpO2: 95%   General:  Appears calm and comfortable and is in NAD Eyes:  PERRL, EOMI,  normal lids, iris ENT:  grossly normal hearing, lips & tongue, mmm; appropriate dentition Neck:  no LAD, masses or thyromegaly; no carotid bruits Cardiovascular:  RRR, no m/r/g. No LE edema.  Respiratory:   CTA bilaterally with no wheezes/rales/rhonchi.  Normal respiratory effort. Abdomen:  soft, NT, ND, NABS Back:   normal alignment, no CVAT Skin:  no rash or induration seen on limited exam Musculoskeletal:  left leg is abducted and shortened. Sensation intact. No edeam around ankle or knee. No bruising. +valgus and varus strain. 2+pedal pulses. TTP over left femoral head/lateral leg. , Lower extremity:  No LE edema.  Limited  foot exam with no ulcerations.  2+ distal pulses. Psychiatric:  grossly normal mood and affect, speech fluent and appropriate, AOx3 Neurologic:  CN 2-12 grossly intact, moves all extremities in coordinated fashion, sensation intact   Radiological Exams on Admission: Independently reviewed - see discussion in A/P where applicable  DG Chest 1 View  Result Date: 06/04/2022 CLINICAL DATA:  History of hypertension and prolonged QT interval. Left femoral neck fracture, preoperative assessment EXAM: CHEST  1 VIEW COMPARISON:  03/03/2021 FINDINGS: Upper normal heart size. Tortuous thoracic aorta. Skin fold along the left lateral chest. The lungs appear clear. No blunting of the costophrenic angles. Atherosclerotic calcification of the aortic arch. IMPRESSION: 1. No active cardiopulmonary disease is radiographically apparent. 2.  Aortic Atherosclerosis (ICD10-I70.0). Electronically Signed   By: Van Clines M.D.   On: 06/04/2022 16:07   DG Hip Unilat W or Wo Pelvis 2-3 Views Left  Result Date: 06/04/2022 CLINICAL DATA:  Mechanical fall with pain to the left hip EXAM: DG HIP (WITH OR WITHOUT PELVIS) 2V LEFT COMPARISON:  Pelvis radiograph dated 03/04/2021 FINDINGS: Transcervical left femoral fracture with foreshortening of the left femur and apex lateral angulation. The femoroacetabular joint remains intact. Postsurgical changes from right hip arthroplasty. Hardware appears intact. IMPRESSION: Transcervical left femoral fracture with foreshortening and apex lateral angulation. Electronically Signed   By: Darrin Nipper M.D.   On: 06/04/2022 13:36    EKG: pending    Labs on Admission: I have personally reviewed the available labs and imaging studies at the time of the admission.  Pertinent labs:   Hgb 9.9  Assessment and Plan: Principal Problem:   Closed displaced fracture of left femoral neck (HCC) Active Problems:   Normocytic anemia   Controlled type 2 diabetes mellitus without complication,  without long-term current use of insulin (HCC)   Other cirrhosis of liver (HCC)   Essential hypertension   CKD (chronic kidney disease) stage 2, GFR 60-89 ml/min   GERD (gastroesophageal reflux disease)   Anxiety    Assessment and Plan: * Closed displaced fracture of left femoral neck ([place]) 77 year old presenting with fall and left hip pain found to have transcervical left femoral fracture and foreshortening and apex lateral angulation.  -admit to med-surg at [facility] consulted with plan for surgery tomorrow (Dr. Ninfa Linden) -NPO except for meds at midnight  -SCDs -pain medication with tylenol, norco and morphine for severe pain -hip fracture protocol  -nerve block ordered per ortho  -check vitamin D  -EKG pending, hx of prolonged QT in the past    Normocytic anemia Baseline around 10,stable Check iron studies No signs of bleeding Trend   Controlled type 2 diabetes mellitus without complication, without long-term current use of insulin (HCC) Appears to be diet controlled  A1C pending SSI and accuchecks qac/hs   Other cirrhosis of liver ([place]) -with esophageal varices.  -  can not see any notes from GI -check hepatic panel  -check INR, platelets wnl  -continue propranolol BID   Essential hypertension Well controlled continue inderal   CKD (chronic kidney disease) stage 2, GFR 60-89 ml/min Stable, continue to monitor   GERD (gastroesophageal reflux disease) Continue PPI BID  Anxiety Continue prozac '20mg'$  daily     Advance Care Planning:   Code Status: Full Code   Consults: orthopedics: Dr Ninfa Linden   DVT Prophylaxis: SCDs   Family Communication: daughter at bedside   Severity of Illness: The appropriate patient status for this patient is INPATIENT. Inpatient status is judged to be reasonable and necessary in order to provide the required intensity of service to ensure the patient's safety. The patient's presenting symptoms, physical exam findings, and initial  radiographic and laboratory data in the context of their chronic comorbidities is felt to place them at high risk for further clinical deterioration. Furthermore, it is not anticipated that the patient will be medically stable for discharge from the hospital within 2 midnights of admission.   * I certify that at the point of admission it is my clinical judgment that the patient will require inpatient hospital care spanning beyond 2 midnights from the point of admission due to high intensity of service, high risk for further deterioration and high frequency of surveillance required.*  Author: Orma Flaming, MD 06/04/2022 5:04 PM  For on call review [email or web address].

## 2022-06-04 NOTE — Assessment & Plan Note (Addendum)
-  with esophageal varices.  -can not see any notes from GI -check hepatic panel  -check INR, platelets wnl  -continue propranolol BID

## 2022-06-04 NOTE — Assessment & Plan Note (Addendum)
Appears to be diet controlled  A1C pending SSI and accuchecks qac/hs

## 2022-06-04 NOTE — ED Notes (Signed)
Patient transported to MRI 

## 2022-06-04 NOTE — Assessment & Plan Note (Addendum)
Continue PPI BID

## 2022-06-05 ENCOUNTER — Other Ambulatory Visit: Payer: Self-pay

## 2022-06-05 ENCOUNTER — Inpatient Hospital Stay: Admit: 2022-06-05 | Payer: Medicare Other | Admitting: Orthopaedic Surgery

## 2022-06-05 ENCOUNTER — Inpatient Hospital Stay (HOSPITAL_COMMUNITY): Payer: Medicare Other

## 2022-06-05 ENCOUNTER — Inpatient Hospital Stay (HOSPITAL_COMMUNITY): Payer: Medicare Other | Admitting: Anesthesiology

## 2022-06-05 ENCOUNTER — Encounter (HOSPITAL_COMMUNITY)
Admission: EM | Disposition: A | Discharge status: Skilled Nursing Facility | Payer: Self-pay | Source: Home / Self Care | Attending: Family Medicine

## 2022-06-05 DIAGNOSIS — I10 Essential (primary) hypertension: Secondary | ICD-10-CM

## 2022-06-05 DIAGNOSIS — F418 Other specified anxiety disorders: Secondary | ICD-10-CM

## 2022-06-05 DIAGNOSIS — S72002A Fracture of unspecified part of neck of left femur, initial encounter for closed fracture: Secondary | ICD-10-CM | POA: Diagnosis not present

## 2022-06-05 DIAGNOSIS — S72042A Displaced fracture of base of neck of left femur, initial encounter for closed fracture: Secondary | ICD-10-CM

## 2022-06-05 DIAGNOSIS — E119 Type 2 diabetes mellitus without complications: Secondary | ICD-10-CM

## 2022-06-05 DIAGNOSIS — S72012A Unspecified intracapsular fracture of left femur, initial encounter for closed fracture: Secondary | ICD-10-CM

## 2022-06-05 HISTORY — PX: TOTAL HIP ARTHROPLASTY: SHX124

## 2022-06-05 LAB — URINALYSIS, ROUTINE W REFLEX MICROSCOPIC
Bilirubin Urine: NEGATIVE
Glucose, UA: NEGATIVE mg/dL
Hgb urine dipstick: NEGATIVE
Ketones, ur: 5 mg/dL — AB
Leukocytes,Ua: NEGATIVE
Nitrite: NEGATIVE
Protein, ur: NEGATIVE mg/dL
Specific Gravity, Urine: 1.019 (ref 1.005–1.030)
pH: 5 (ref 5.0–8.0)

## 2022-06-05 LAB — CBC
HCT: 32 % — ABNORMAL LOW (ref 36.0–46.0)
HCT: 29 % — ABNORMAL LOW (ref 36.0–46.0)
Hemoglobin: 9.6 g/dL — ABNORMAL LOW (ref 12.0–15.0)
Hemoglobin: 8.8 g/dL — ABNORMAL LOW (ref 12.0–15.0)
MCH: 25.5 pg — ABNORMAL LOW (ref 26.0–34.0)
MCH: 26 pg (ref 26.0–34.0)
MCHC: 30 g/dL (ref 30.0–36.0)
MCHC: 30.3 g/dL (ref 30.0–36.0)
MCV: 85.1 fL (ref 80.0–100.0)
MCV: 85.8 fL (ref 80.0–100.0)
Platelets: 132 10*3/uL — ABNORMAL LOW (ref 150–400)
Platelets: 139 10*3/uL — ABNORMAL LOW (ref 150–400)
RBC: 3.76 MIL/uL — ABNORMAL LOW (ref 3.87–5.11)
RBC: 3.38 MIL/uL — ABNORMAL LOW (ref 3.87–5.11)
RDW: 16.8 % — ABNORMAL HIGH (ref 11.5–15.5)
RDW: 16.7 % — ABNORMAL HIGH (ref 11.5–15.5)
WBC: 5.9 10*3/uL (ref 4.0–10.5)
WBC: 7.4 10*3/uL (ref 4.0–10.5)
nRBC: 0 % (ref 0.0–0.2)
nRBC: 0 % (ref 0.0–0.2)

## 2022-06-05 LAB — GLUCOSE, CAPILLARY
Glucose-Capillary: 144 mg/dL — ABNORMAL HIGH (ref 70–99)
Glucose-Capillary: 111 mg/dL — ABNORMAL HIGH (ref 70–99)
Glucose-Capillary: 161 mg/dL — ABNORMAL HIGH (ref 70–99)

## 2022-06-05 LAB — BASIC METABOLIC PANEL
Anion gap: 8 (ref 5–15)
BUN: 15 mg/dL (ref 8–23)
CO2: 25 mmol/L (ref 22–32)
Calcium: 8.7 mg/dL — ABNORMAL LOW (ref 8.9–10.3)
Chloride: 104 mmol/L (ref 98–111)
Creatinine, Ser: 0.7 mg/dL (ref 0.44–1.00)
GFR, Estimated: >60 mL/min (ref >60)
Glucose, Bld: 137 mg/dL — ABNORMAL HIGH (ref 70–99)
Potassium: 3.8 mmol/L (ref 3.5–5.1)
Sodium: 137 mmol/L (ref 135–145)

## 2022-06-05 LAB — VITAMIN D 25 HYDROXY (VIT D DEFICIENCY, FRACTURES): Vit D, 25-Hydroxy: 43.72 ng/mL (ref 30–100)

## 2022-06-05 SURGERY — ARTHROPLASTY, HIP, TOTAL, ANTERIOR APPROACH
Anesthesia: General | Site: Hip | Laterality: Left

## 2022-06-05 MED ORDER — PHENYLEPHRINE 80 MCG/ML (10ML) SYRINGE FOR IV PUSH (FOR BLOOD PRESSURE SUPPORT)
PREFILLED_SYRINGE | INTRAVENOUS | Status: AC
  Filled 2022-06-05: qty 10

## 2022-06-05 MED ORDER — 0.9 % SODIUM CHLORIDE (POUR BTL) OPTIME
TOPICAL | Status: DC | PRN
  Administered 2022-06-05: 1000 mL

## 2022-06-05 MED ORDER — PHENOL 1.4 % MT LIQD: 1.0000 | OROMUCOSAL | Status: DC | PRN

## 2022-06-05 MED ORDER — FENTANYL CITRATE (PF) 100 MCG/2ML IJ SOLN
INTRAMUSCULAR | Status: DC | PRN
  Administered 2022-06-05 (×2): 50 ug via INTRAVENOUS

## 2022-06-05 MED ORDER — HYDROMORPHONE HCL 1 MG/ML IJ SOLN: 0.5000 mg | INTRAMUSCULAR | Status: DC | PRN

## 2022-06-05 MED ORDER — VASOPRESSIN 20 UNIT/ML IV SOLN
INTRAVENOUS | Status: DC | PRN
  Administered 2022-06-05: 2 [IU] via INTRAVENOUS
  Administered 2022-06-05: 3 [IU] via INTRAVENOUS
  Administered 2022-06-05: 2 [IU] via INTRAVENOUS

## 2022-06-05 MED ORDER — FENTANYL CITRATE (PF) 100 MCG/2ML IJ SOLN
INTRAMUSCULAR | Status: AC
  Filled 2022-06-05: qty 2

## 2022-06-05 MED ORDER — PANTOPRAZOLE SODIUM 40 MG PO TBEC: 40.0000 mg | DELAYED_RELEASE_TABLET | Freq: Every day | ORAL | Status: DC

## 2022-06-05 MED ORDER — OXYCODONE HCL 5 MG PO TABS: 10.0000 mg | ORAL_TABLET | ORAL | Status: DC | PRN

## 2022-06-05 MED ORDER — DOCUSATE SODIUM 100 MG PO CAPS
100.0000 mg | ORAL_CAPSULE | Freq: Two times a day (BID) | ORAL | Status: DC
  Administered 2022-06-05 – 2022-06-07 (×4): 100 mg via ORAL
  Filled 2022-06-05 (×5): qty 1

## 2022-06-05 MED ORDER — OXYCODONE HCL 5 MG/5ML PO SOLN: 5.0000 mg | Freq: Once | ORAL | Status: DC | PRN

## 2022-06-05 MED ORDER — CEFAZOLIN SODIUM-DEXTROSE 2-4 GM/100ML-% IV SOLN
2.0000 g | INTRAVENOUS | Status: AC
  Administered 2022-06-05: 2 g via INTRAVENOUS
  Filled 2022-06-05: qty 100

## 2022-06-05 MED ORDER — PHENYLEPHRINE HCL-NACL 20-0.9 MG/250ML-% IV SOLN
INTRAVENOUS | Status: DC | PRN
  Administered 2022-06-05: 40 ug/min via INTRAVENOUS

## 2022-06-05 MED ORDER — OXYCODONE HCL 5 MG PO TABS: 5.0000 mg | ORAL_TABLET | Freq: Once | ORAL | Status: DC | PRN

## 2022-06-05 MED ORDER — AMISULPRIDE (ANTIEMETIC) 5 MG/2ML IV SOLN: 10.0000 mg | Freq: Once | INTRAVENOUS | Status: DC | PRN

## 2022-06-05 MED ORDER — SODIUM CHLORIDE 0.9 % IV SOLN: INTRAVENOUS | Status: DC

## 2022-06-05 MED ORDER — ALUM & MAG HYDROXIDE-SIMETH 200-200-20 MG/5ML PO SUSP: 30.0000 mL | ORAL | Status: DC | PRN

## 2022-06-05 MED ORDER — METOCLOPRAMIDE HCL 5 MG PO TABS: 5.0000 mg | ORAL_TABLET | Freq: Three times a day (TID) | ORAL | Status: DC | PRN

## 2022-06-05 MED ORDER — ALBUMIN HUMAN 5 % IV SOLN
INTRAVENOUS | Status: AC
  Filled 2022-06-05: qty 250

## 2022-06-05 MED ORDER — TRANEXAMIC ACID-NACL 1000-0.7 MG/100ML-% IV SOLN
1000.0000 mg | INTRAVENOUS | Status: AC
  Administered 2022-06-05: 1000 mg via INTRAVENOUS
  Filled 2022-06-05: qty 100

## 2022-06-05 MED ORDER — LIDOCAINE 2% (20 MG/ML) 5 ML SYRINGE
INTRAMUSCULAR | Status: DC | PRN
  Administered 2022-06-05: 60 mg via INTRAVENOUS

## 2022-06-05 MED ORDER — MENTHOL 3 MG MT LOZG: 1.0000 | LOZENGE | OROMUCOSAL | Status: DC | PRN

## 2022-06-05 MED ORDER — ASPIRIN 81 MG PO CHEW
81.0000 mg | CHEWABLE_TABLET | Freq: Two times a day (BID) | ORAL | Status: DC
  Administered 2022-06-05 – 2022-06-08 (×5): 81 mg via ORAL
  Filled 2022-06-05 (×5): qty 1

## 2022-06-05 MED ORDER — VASOPRESSIN 20 UNIT/ML IV SOLN
INTRAVENOUS | Status: AC
  Filled 2022-06-05: qty 1

## 2022-06-05 MED ORDER — LACTATED RINGERS IV SOLN: INTRAVENOUS | Status: DC

## 2022-06-05 MED ORDER — DIPHENHYDRAMINE HCL 12.5 MG/5ML PO ELIX
12.5000 mg | ORAL_SOLUTION | ORAL | Status: DC | PRN
  Administered 2022-06-05: 25 mg via ORAL
  Filled 2022-06-05: qty 10

## 2022-06-05 MED ORDER — METOCLOPRAMIDE HCL 5 MG/ML IJ SOLN: 5.0000 mg | Freq: Three times a day (TID) | INTRAMUSCULAR | Status: DC | PRN

## 2022-06-05 MED ORDER — OXYCODONE HCL 5 MG PO TABS
5.0000 mg | ORAL_TABLET | ORAL | Status: DC | PRN
  Filled 2022-06-05: qty 2

## 2022-06-05 MED ORDER — PROPOFOL 10 MG/ML IV BOLUS
INTRAVENOUS | Status: DC | PRN
  Administered 2022-06-05: 100 mg via INTRAVENOUS

## 2022-06-05 MED ORDER — STERILE WATER FOR IRRIGATION IR SOLN
Status: DC | PRN
  Administered 2022-06-05: 2000 mL

## 2022-06-05 MED ORDER — ONDANSETRON HCL 4 MG PO TABS: 4.0000 mg | ORAL_TABLET | Freq: Four times a day (QID) | ORAL | Status: DC | PRN

## 2022-06-05 MED ORDER — ONDANSETRON HCL 4 MG/2ML IJ SOLN
INTRAMUSCULAR | Status: DC | PRN
  Administered 2022-06-05: 4 mg via INTRAVENOUS

## 2022-06-05 MED ORDER — CHLORHEXIDINE GLUCONATE 4 % EX LIQD: 60.0000 mL | Freq: Once | CUTANEOUS | Status: DC

## 2022-06-05 MED ORDER — METHOCARBAMOL 1000 MG/10ML IJ SOLN: 500.0000 mg | Freq: Four times a day (QID) | INTRAVENOUS | Status: DC | PRN

## 2022-06-05 MED ORDER — EPHEDRINE SULFATE-NACL 50-0.9 MG/10ML-% IV SOSY
PREFILLED_SYRINGE | INTRAVENOUS | Status: DC | PRN
  Administered 2022-06-05: 20 mg via INTRAVENOUS
  Administered 2022-06-05: 5 mg via INTRAVENOUS

## 2022-06-05 MED ORDER — METHOCARBAMOL 500 MG PO TABS
500.0000 mg | ORAL_TABLET | Freq: Four times a day (QID) | ORAL | Status: DC | PRN
  Administered 2022-06-08: 500 mg via ORAL
  Filled 2022-06-05 (×3): qty 1

## 2022-06-05 MED ORDER — ROCURONIUM BROMIDE 10 MG/ML (PF) SYRINGE
PREFILLED_SYRINGE | INTRAVENOUS | Status: DC | PRN
  Administered 2022-06-05: 50 mg via INTRAVENOUS

## 2022-06-05 MED ORDER — FENTANYL CITRATE PF 50 MCG/ML IJ SOSY
PREFILLED_SYRINGE | INTRAMUSCULAR | Status: AC
  Filled 2022-06-05: qty 2

## 2022-06-05 MED ORDER — ALBUMIN HUMAN 5 % IV SOLN: INTRAVENOUS | Status: DC | PRN

## 2022-06-05 MED ORDER — DEXAMETHASONE SODIUM PHOSPHATE 10 MG/ML IJ SOLN
INTRAMUSCULAR | Status: DC | PRN
  Administered 2022-06-05: 8 mg via INTRAVENOUS

## 2022-06-05 MED ORDER — SODIUM CHLORIDE 0.9 % IR SOLN
Status: DC | PRN
  Administered 2022-06-05: 1000 mL

## 2022-06-05 MED ORDER — HYDROMORPHONE HCL 1 MG/ML IJ SOLN: 0.2500 mg | INTRAMUSCULAR | Status: DC | PRN

## 2022-06-05 MED ORDER — SUGAMMADEX SODIUM 200 MG/2ML IV SOLN
INTRAVENOUS | Status: DC | PRN
  Administered 2022-06-05: 400 mg via INTRAVENOUS

## 2022-06-05 MED ORDER — PHENYLEPHRINE 80 MCG/ML (10ML) SYRINGE FOR IV PUSH (FOR BLOOD PRESSURE SUPPORT)
PREFILLED_SYRINGE | INTRAVENOUS | Status: DC | PRN
  Administered 2022-06-05: 160 ug via INTRAVENOUS
  Administered 2022-06-05 (×2): 80 ug via INTRAVENOUS
  Administered 2022-06-05: 160 ug via INTRAVENOUS

## 2022-06-05 MED ORDER — CEFAZOLIN SODIUM-DEXTROSE 1-4 GM/50ML-% IV SOLN
1.0000 g | Freq: Four times a day (QID) | INTRAVENOUS | Status: AC
  Administered 2022-06-05 – 2022-06-06 (×2): 1 g via INTRAVENOUS
  Filled 2022-06-05 (×2): qty 50

## 2022-06-05 MED ORDER — ONDANSETRON HCL 4 MG/2ML IJ SOLN
4.0000 mg | Freq: Four times a day (QID) | INTRAMUSCULAR | Status: DC | PRN
  Administered 2022-06-06: 4 mg via INTRAVENOUS
  Filled 2022-06-05: qty 2

## 2022-06-05 MED ORDER — POVIDONE-IODINE 10 % EX SWAB: 2.0000 | Freq: Once | CUTANEOUS | Status: DC

## 2022-06-05 MED ORDER — ACETAMINOPHEN 325 MG PO TABS
325.0000 mg | ORAL_TABLET | Freq: Four times a day (QID) | ORAL | Status: DC | PRN
  Administered 2022-06-06 – 2022-06-08 (×3): 650 mg via ORAL
  Filled 2022-06-05 (×3): qty 2

## 2022-06-05 SURGICAL SUPPLY — 43 items
APL SKNCLS STERI-STRIP NONHPOA (GAUZE/BANDAGES/DRESSINGS)
ARTICULEZE HEAD (Hips) ×1 IMPLANT
BAG COUNTER SPONGE SURGICOUNT (BAG) ×1 IMPLANT
BAG SPEC THK2 15X12 ZIP CLS (MISCELLANEOUS)
BAG SPNG CNTER NS LX DISP (BAG) ×1
BAG ZIPLOCK 12X15 (MISCELLANEOUS) IMPLANT
BENZOIN TINCTURE PRP APPL 2/3 (GAUZE/BANDAGES/DRESSINGS) IMPLANT
BLADE SAW SGTL 18X1.27X75 (BLADE) ×1 IMPLANT
COVER PERINEAL POST (MISCELLANEOUS) ×1 IMPLANT
COVER SURGICAL LIGHT HANDLE (MISCELLANEOUS) ×1 IMPLANT
DRAPE FOOT SWITCH (DRAPES) ×1 IMPLANT
DRAPE STERI IOBAN 125X83 (DRAPES) ×1 IMPLANT
DRAPE U-SHAPE 47X51 STRL (DRAPES) ×2 IMPLANT
DRSG AQUACEL AG ADV 3.5X10 (GAUZE/BANDAGES/DRESSINGS) ×1 IMPLANT
DURAPREP 26ML APPLICATOR (WOUND CARE) ×1 IMPLANT
ELECT REM PT RETURN 15FT ADLT (MISCELLANEOUS) ×1 IMPLANT
FEM STEM 12/14 TAPER SZ 4 HIP (Orthopedic Implant) ×1 IMPLANT
FEMORAL STEM 12/14 TPR SZ4 HIP (Orthopedic Implant) IMPLANT
GAUZE XEROFORM 1X8 LF (GAUZE/BANDAGES/DRESSINGS) ×1 IMPLANT
GLOVE BIO SURGEON STRL SZ7.5 (GLOVE) ×1 IMPLANT
GLOVE BIOGEL PI IND STRL 8 (GLOVE) ×2 IMPLANT
GLOVE ECLIPSE 8.0 STRL XLNG CF (GLOVE) ×1 IMPLANT
GOWN STRL REUS W/ TWL XL LVL3 (GOWN DISPOSABLE) ×2 IMPLANT
GOWN STRL REUS W/TWL XL LVL3 (GOWN DISPOSABLE) ×2
HANDPIECE INTERPULSE COAX TIP (DISPOSABLE) ×1
HEAD ARTICULEZE (Hips) IMPLANT
HOLDER FOLEY CATH W/STRAP (MISCELLANEOUS) ×1 IMPLANT
KIT TURNOVER KIT A (KITS) IMPLANT
LINER NEUTRAL 52X36MM PLUS 4 (Liner) IMPLANT
PACK ANTERIOR HIP CUSTOM (KITS) ×1 IMPLANT
PIN SECTOR W/GRIP ACE CUP 52MM (Hips) IMPLANT
SET HNDPC FAN SPRY TIP SCT (DISPOSABLE) ×1 IMPLANT
STAPLER VISISTAT 35W (STAPLE) IMPLANT
STRIP CLOSURE SKIN 1/2X4 (GAUZE/BANDAGES/DRESSINGS) IMPLANT
SUT ETHIBOND NAB CT1 #1 30IN (SUTURE) ×1 IMPLANT
SUT ETHILON 2 0 PS N (SUTURE) IMPLANT
SUT MNCRL AB 4-0 PS2 18 (SUTURE) IMPLANT
SUT VIC AB 0 CT1 36 (SUTURE) ×1 IMPLANT
SUT VIC AB 1 CT1 36 (SUTURE) ×1 IMPLANT
SUT VIC AB 2-0 CT1 27 (SUTURE) ×2
SUT VIC AB 2-0 CT1 TAPERPNT 27 (SUTURE) ×2 IMPLANT
TRAY FOLEY MTR SLVR 16FR STAT (SET/KITS/TRAYS/PACK) IMPLANT
YANKAUER SUCT BULB TIP NO VENT (SUCTIONS) ×1 IMPLANT

## 2022-06-05 NOTE — Anesthesia Preprocedure Evaluation (Signed)
Anesthesia Evaluation  Patient identified by MRN, date of birth, ID band Patient awake    Reviewed: Allergy & Precautions, NPO status , Patient's Chart, lab work & pertinent test results, reviewed documented beta blocker date and time   History of Anesthesia Complications Negative for: history of anesthetic complications  Airway Mallampati: I  TM Distance: >3 FB Neck ROM: Full    Dental  (+) Edentulous Upper, Edentulous Lower, Dental Advisory Given   Pulmonary neg pulmonary ROS   Pulmonary exam normal        Cardiovascular hypertension, Pt. on home beta blockers Normal cardiovascular exam     Neuro/Psych   Anxiety Depression    negative neurological ROS     GI/Hepatic Neg liver ROS,GERD  ,,  Endo/Other  diabetes, Type 2    Renal/GU Renal InsufficiencyRenal disease     Musculoskeletal negative musculoskeletal ROS (+)    Abdominal   Peds  Hematology negative hematology ROS (+) Blood dyscrasia, anemia   Anesthesia Other Findings   Reproductive/Obstetrics                             Anesthesia Physical Anesthesia Plan  ASA: 3  Anesthesia Plan: General   Post-op Pain Management: Dilaudid IV   Induction: Intravenous  PONV Risk Score and Plan: 3 and Ondansetron, Dexamethasone, Midazolam and Treatment may vary due to age or medical condition  Airway Management Planned: Oral ETT  Additional Equipment:   Intra-op Plan:   Post-operative Plan: Extubation in OR  Informed Consent: I have reviewed the patients History and Physical, chart, labs and discussed the procedure including the risks, benefits and alternatives for the proposed anesthesia with the patient or authorized representative who has indicated his/her understanding and acceptance.     Dental advisory given  Plan Discussed with: Anesthesiologist, CRNA and Surgeon  Anesthesia Plan Comments:         Anesthesia Quick  Evaluation

## 2022-06-05 NOTE — Progress Notes (Signed)
Patient ID: Alexis Cox, female   DOB: [birth year], 77 y.o.   MRN: [id number] I have reviewed the patient's notes and her vital signs.  We also looked at her labs.  She has a left displaced femoral neck fracture.  We have recommended a left total hip replacement to treat this injury.  I am very familiar with the patient.  She had a mechanical fall off a porch in 2022 and I replaced her right hip at that standpoint due to a displaced femoral neck fracture.  She understands this is similar to what we will do today to address her left displaced femoral neck fracture.  Having had this before she is fully aware of the risks and benefits of surgery and this has been discussed again.  Informed consent has been obtained and the left operative hip has been marked.

## 2022-06-05 NOTE — Anesthesia Procedure Notes (Signed)
Procedure Name: Intubation Date/Time: 06/05/2022 4:06 PM  Performed by: Niel Hummer, CRNAPre-anesthesia Checklist: Patient identified, Emergency Drugs available, Suction available and Patient being monitored Patient Re-evaluated:Patient Re-evaluated prior to induction Oxygen Delivery Method: Circle system utilized Preoxygenation: Pre-oxygenation with 100% oxygen Induction Type: IV induction Ventilation: Mask ventilation without difficulty Laryngoscope Size: Miller and 2 Grade View: Grade I Tube type: Oral Tube size: 7.0 mm Number of attempts: 1 Airway Equipment and Method: Stylet Placement Confirmation: ETT inserted through vocal cords under direct vision, positive ETCO2 and breath sounds checked- equal and bilateral Secured at: 21 cm Tube secured with: Tape Dental Injury: Teeth and Oropharynx as per pre-operative assessment

## 2022-06-05 NOTE — Progress Notes (Signed)
Initial Nutrition Assessment  DOCUMENTATION CODES:   Not applicable  INTERVENTION:  - Advance diet to Regular after OR as medically appropriate.  - Recommend Ensure Plus High Protein po BID once diet advanced, each supplement provides 350 kcal and 20 grams of protein. - Encourage intake at all meals of protein rich food sources to promote healing.  - Monitor weight trends.   NUTRITION DIAGNOSIS:   Increased nutrient needs related to hip fracture as evidenced by estimated needs.  GOAL:   Patient will meet greater than or equal to 90% of their needs  MONITOR:   PO intake, Diet advancement, Supplement acceptance, Weight trends  REASON FOR ASSESSMENT:   Consult Hip fracture protocol  ASSESSMENT:   77 y.o. female with medical history significant of HTN,cirrhosis, GERD, HLD, CKD stage 2, T2DM and depression who presented to ED after a fall. Admitted for closed displaced fracture of L femoral neck.   Patient endorses a UBW of 158# and notes that she lost "a lot of weight" three years ago but has been holding steady recently.  Per EMR, last weight prior this admission was back in November 2022, when patient weighed at 150#.  Patient reports eating 2-3 meals a day at home. Usually has oatmeal of cereal for breakfast, a banana and peanut butter sandwich for lunch, and a salad for dinner.  Appetite has been decreased for a long time.   Patient is currently NPO for surgery today. Discussed increased calorie and protein needs for healing of fracture after surgery.  Patient agreeable to try Ensure once diet advanced.   Medications reviewed and include: Insulin  Labs reviewed:  HA1C 6.3 (03/04/21)   NUTRITION - FOCUSED PHYSICAL EXAM:  Flowsheet Row Most Recent Value  Orbital Region Mild depletion  Upper Arm Region No depletion  Thoracic and Lumbar Region No depletion  Buccal Region No depletion  Temple Region Moderate depletion  Clavicle Bone Region No depletion  Clavicle  and Acromion Bone Region Mild depletion  Scapular Bone Region Unable to assess  Dorsal Hand No depletion  Patellar Region Moderate depletion  Anterior Thigh Region Moderate depletion  Posterior Calf Region Moderate depletion  Edema (RD Assessment) None  Hair Reviewed  Eyes Reviewed  Mouth Reviewed  Skin Reviewed  Nails Reviewed       Diet Order:   Diet Order             Diet NPO time specified Except for: Sips with Meds  Diet effective midnight                   EDUCATION NEEDS:  Education needs have been addressed  Skin:  Skin Assessment: Reviewed RN Assessment  Last BM:  PTA  Height:  Ht Readings from Last 1 Encounters:  06/04/22 '5\' 1"'$  (1.549 m)   Weight:  Wt Readings from Last 1 Encounters:  06/04/22 69.2 kg    BMI:  Body mass index is 28.83 kg/m.  Estimated Nutritional Needs:  Kcal:  1950-2100 kcals Protein:  85-95 grams Fluid:  >/= 1.9L    Samson Frederic RD, LDN For contact information, refer to [facility].

## 2022-06-05 NOTE — TOC Initial Note (Signed)
Transition of Care [facility]) - Initial/Assessment Note    Patient Details  Name: Alexis Cox MRN: [id number] Date of Birth: 01-Dec-1945  Transition of Care [facility]) CM/SW Contact:    Lennart Pall, LCSW Phone Number: 06/05/2022, 1:33 PM  Clinical Narrative:                 Met with pt and daughter today to review anticipated dc needs/ plans.  Pt currently awaiting surgery today.  Notes that she had broken other hip ~ a year ago and was discharged to [facility] in [place] - pt and daughter would like to do this again.  They would like to have [facility] again as they were pleased with therapy program there.  We reviewed need to secure and bed and get insurance auth after therapy has been able to see her here.  TOC continues to follow.  Expected Discharge Plan: Skilled Nursing Facility Barriers to Discharge: Continued Medical Work up, Ship broker   Patient Goals and CMS Choice Patient states their goals for this hospitalization and ongoing recovery are:: return home following SNF rehab          Expected Discharge Plan and Services In-house Referral: Clinical Social Work   Post Acute Care Choice: [employer] Living arrangements for the past 2 months: Single Family Home                 DME Arranged: N/A DME Agency: NA                  Prior Living Arrangements/Services Living arrangements for the past 2 months: Single Family Home Lives with:: Spouse Patient language and need for interpreter reviewed:: Yes Do you feel safe going back to the place where you live?: Yes      Need for Family Participation in Patient Care: Yes (Comment) Care giver support system in place?: Yes (comment)   Criminal Activity/Legal Involvement Pertinent to Current Situation/Hospitalization: No - Comment as needed  Activities of Daily Living Home Assistive Devices/Equipment: Cane (specify quad or straight), Eyeglasses ADL Screening (condition at time of  admission) Patient's cognitive ability adequate to safely complete daily activities?: Yes Is the patient deaf or have difficulty hearing?: Yes Does the patient have difficulty seeing, even when wearing glasses/contacts?: No Does the patient have difficulty concentrating, remembering, or making decisions?: Yes (sometimes) Patient able to express need for assistance with ADLs?: Yes Does the patient have difficulty dressing or bathing?: No Independently performs ADLs?: Yes (appropriate for developmental age) Does the patient have difficulty walking or climbing stairs?: Yes Weakness of Legs: None Weakness of Arms/Hands: None  Permission Sought/Granted Permission sought to share information with : Family Supports Permission granted to share information with : Yes, Verbal Permission Granted  Share Information with NAME: Robinette Haines     Permission granted to share info w Relationship: daughter  Permission granted to share info w Contact Information: [phone]  Emotional Assessment Appearance:: Appears stated age Attitude/Demeanor/Rapport: Gracious Affect (typically observed): Accepting Orientation: : Oriented to Self, Oriented to Place, Oriented to  Time, Oriented to Situation Alcohol / Substance Use: Not Applicable Psych Involvement: No (comment)  Admission diagnosis:  Fall, initial encounter [W19.XXXA] Closed displaced fracture of left femoral neck (HCC) [S72.002A] Closed fracture of left femur, unspecified fracture morphology, unspecified portion of femur, initial encounter [facility]) [S72.92XA] Patient Active Problem List   Diagnosis Date Noted   Essential hypertension 06/04/2022   Closed displaced fracture of left femoral neck ([place]) 06/04/2022   Normocytic anemia  06/04/2022   Controlled type 2 diabetes mellitus without complication, without long-term current use of insulin ([place]) 06/04/2022   GERD (gastroesophageal reflux disease) 06/04/2022   CKD (chronic kidney disease) stage 2,  GFR 60-89 ml/min 06/04/2022   Anxiety 06/04/2022   Prolonged QT interval 03/03/2021   Impaired glucose tolerance 03/03/2021   Other cirrhosis of liver ([place]) 08/10/2017   PCP:  Leonides Sake, MD Pharmacy:   [facility] [place], [place] - [address] [place] [address] Phone: [phone] Fax: [phone]     Social Determinants of Health ([place]) Social History: SDOH Screenings   Food Insecurity: No Food Insecurity (06/04/2022)  Housing: Low Risk  (06/04/2022)  Transportation Needs: No Transportation Needs (06/04/2022)  Utilities: Not At Risk (06/04/2022)  Tobacco Use: Low Risk  (06/04/2022)   SDOH Interventions: Food Insecurity Interventions: Intervention Not Indicated Housing Interventions: Intervention Not Indicated Transportation Interventions: Intervention Not Indicated Utilities Interventions: Intervention Not Indicated   Readmission Risk Interventions    06/05/2022    1:31 PM  Readmission Risk Prevention Plan  Post Dischage Appt Complete  Medication Screening Complete  Transportation Screening Complete

## 2022-06-05 NOTE — Op Note (Signed)
Operative Note  Date of operation: 06/05/2022 Preoperative diagnosis: Left hip displaced subcapital femoral neck fracture Postoperative diagnosis: Same  Procedure: Left direct anterior total hip arthroplasty  Implants: Implant Name Type Inv. Item Serial No. Manufacturer Lot No. LRB No. Used Action  PIN SECTOR W/GRIP ACE CUP 52MM - [id number] Hips PIN SECTOR W/GRIP ACE CUP 52MM  [employer]PU[employer] [id number] Left 1 Implanted  LINER NEUTRAL 52X36MM PLUS 4 - [id number] Liner LINER NEUTRAL 52X36MM PLUS 4  DEPUY ORTH[employer] [id number] Left 1 Implanted  ARTICULEZE HEAD - [id number] Hips ARTICULEZE HEAD  DEPU[employer]TH[employer] [id number] Left 1 Implanted  FEM STEM 12/14 TAPER SZ 4 HIP - [id number] Orthopedic Implant FEM STEM 12/14 TAPER SZ 4 HIP  DEPUY ORTHOPAED[employer] [id number] Left 1 Implanted   Surgeon: Lind Guest. Ninfa Linden, MD Assistant: Newt Minion, RNFA  Anesthesia: General EBL: 350 cc Antibiotics: 2 g IV Ancef Complications: None  Indications: The patient is a 77 year old female who sustained a hard mechanical fall yesterday when she was in a store and another item fell on her knocking her down.  She was found to have a displaced left hip femoral neck fracture after she was brought to the emergency room with significant left hip pain and the inability to walk.  Actually replaced her right hip before after mechanical fall off of a porch back in November 2022.  She had done well in follow-up and I had released her in July 2023.  She is walking without an assistive device and actually doing quite well.  Yesterday's fall was a significant accident and did cause a displaced subcapital femoral neck fracture.  We have recommended a total hip arthroplasty like she had on her other side.  Of note she is anemic.  We talked about the risk of acute blood loss anemia, nerve or vessel injury, fracture, infection, DVT, dislocation, implant failure, leg length differences and wound healing issues.  We talked about her  goals being hopefully decrease pain, improve mobility and overall improve quality of life.  Procedure description: After informed consent was obtained and the appropriate left hip was marked, the patient was brought to the operating room where general anesthesia was obtained while she was on her stretcher.  She was next placed supine on the Hana fracture table with a perineal post in place in both legs and inline traction boots but no traction applied.  We assessed both hips radiographically and then her left hip was prepped and draped with DuraPrep and sterile drapes.  A timeout was called and she identified as correct patient correct left hip.  An incision was then made just inferior and posterior to the ASIS and carried slightly obliquely down the leg.  Dissection was carried down to the tensor fascia lata muscle and the tensor fascia was then divided longitudinally to proceed with a direct interposed the hip.  Circumflex vessels were identified and cauterized and the hip capsule identified and opened up in L-type format finding a significant hemarthrosis consistent with a femoral neck fracture.  You could right away see there was a displaced femoral neck fracture.  An oscillating saw was used to make a femoral neck cut distal to the fracture and proximal to the lesser trochanter.  This was completed with an osteotome.  Remnants of the femoral neck were removed and then we placed a corkscrew guide the femoral head.  The femoral head was removed easily in its entirety and found no areas of cartilage loss.  We then placed a bent Hohmann over the  medial acetabular rim and moved remnants of the acetabular labrum and other debris.  Reaming was then initiated from a size 43 reamer and stepwise increments going to a size 51 reamer with all reamers placed in direct visualization the last reamer was placed under direct fluoroscopy in order to obtain the depth of reaming, the inclination and the anteversion.  We then  placed the real DePuy sector GRIPTION acetabular height size 52 without difficulty and went with a 36+4 polyethylene liner.  Attention was then turned to the femur.  With the left leg externally rotated to 120 degrees, extended and adducted a Mueller retractor was placed medially and and a Hohmann retractor was placed behind the greater trochanter.  The lateral joint capsule was released and a box cutting osteotome was used into the femoral canal.  Broaching was then initiated from a size 0 broach going and stepwise increments up to a size 4 broach.  Based on her anatomy and her other hip we placed a trial I offset femoral neck and a 36+1.5 trial hip ball.  This was reduced in the acetabulum and based on clinical and radiographic assessment we felt like we needed just a little more leg length.  We dislocated the hip and remove the trial components.  We placed the real Actis femoral component size 4 with high offset and the real 36+5 metal hip ball.  We then brought the leg back over and up and with traction and internal rotation reduced in the pelvis.  We are pleased with leg length, offset, range of motion and stability assessed both radiographically and clinically.  The soft tissue was then irrigated with normal saline solution.  The joint capsule was closed with interrupted #1 Vicryl suture followed by #1 Vicryl close the tensor fascia.  0 Vicryl is used to close the deep tissue and 2-0 Vicryl was used to close subcutaneous tissue.  The skin was closed with staples.  She was definitely hypotensive at the end of the case and taken off of the operating table.  She was awakened and extubated and taken recovery room in guarded but stable.  Postop we will check a CBC with an H&H in the recovery room and address this accordingly.  From an orthopedic standpoint she can mobilize with weightbearing as tolerated and no hip restrictions or precautions.

## 2022-06-05 NOTE — Progress Notes (Signed)
[facility] PROGRESS NOTE  JANELLE GOLDSBERRY (DOB: 1946/03/13) [id number] PCP: Leonides Sake, MD  Brief Narrative: LELANIA DYKE is a 77 y.o. female with a history of cirrhosis, T2DM, HTN, HLD, stage II CKD, GERD, depression, right THA Nov 2022 after mechanical fall who presented to the ED on 06/04/2022 after tripping and falling on the left hip. VSS. X-ray confirmed a transcervical left femoral fracture with foreshortening and apex lateral angulation. Orthopedics requested medicine admission at [facility] for operative management 3/1.   Subjective: Pain controlled at rest, no dyspnea. On ROS +dysuria sometimes. Does feel essentially like a prior episode of UTI. No fevers or abd pain. Not usually on supplemental oxygen.   Objective: BP (!) 161/82 (BP Location: Left Arm)   Pulse 77   Temp 98.6 F (37 C) (Oral)   Resp 18   Ht '5\' 1"'$  (1.549 m)   Wt 69.2 kg   SpO2 98%   BMI 28.83 kg/m   Gen: Pleasant older female in no distress Pulm: Clear, nonlabored  CV: RRR, +II/VI systolic murmur at LSB, no edema or JVD GI: Soft, NT, ND, +BS, protuberant  Neuro: Alert and oriented. No new focal deficits. Ext: Warm, dry. LLE shortened, externally rotated. Skin: No rashes, lesions or ulcers on visualized skin   Assessment & Plan: Mechanical fall with closed, displaced left hip fracture: Vitamin D sufficient at 43.72. - To OR per Dr. Ninfa Linden today for THA (had same contralaterally previously). Keep NPO. - VTE ppx, pain control, weight bearing per ortho.   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 Unable to see PCP or GI records. No available imaging. TBili 1.4, INR 1.2. Thrombocytopenia noted.  - Monitor LFTs, bili, and platelets.   Normocytic anemia: Panel is wnl.  - Monitor postoperatively.   Diet-controlled T2DM: Last HbA1c 6.3%.  - SSI  HTN:  - Continue propranolol.   HLD:  - Continue zetia.  Stage II CKD:  - Avoid nephrotoxins, monitor BMP  GERD:  -  PPI  Depression/anxiety:  - Continue SSRI   Dysuria:  - Check UA  Patrecia Pour, MD [facility] [email or web address] 06/05/2022, 8:47 AM

## 2022-06-05 NOTE — Transfer of Care (Signed)
Immediate Anesthesia Transfer of Care Note  Patient: Alexis Cox  Procedure(s) Performed: TOTAL HIP ARTHROPLASTY ANTERIOR APPROACH (Left: Hip)  Patient Location: PACU  Anesthesia Type:General  Level of Consciousness: sedated  Airway & Oxygen Therapy: Patient Spontanous Breathing and Patient connected to face mask oxygen  Post-op Assessment: Report given to RN and Post -op Vital signs reviewed and stable  Post vital signs: Reviewed and stable  Last Vitals:  Vitals Value Taken Time  BP 140/72 06/05/22 1800  Temp    Pulse 72 06/05/22 1803  Resp 16 06/05/22 1803  SpO2 100 % 06/05/22 1803  Vitals shown include unvalidated device data.  Last Pain:  Vitals:   06/05/22 1341  TempSrc: Oral  PainSc:       Patients Stated Pain Goal: 2 ([id number])  Complications: No notable events documented.

## 2022-06-05 NOTE — Anesthesia Postprocedure Evaluation (Signed)
Anesthesia Post Note  Patient: Alexis Cox  Procedure(s) Performed: TOTAL HIP ARTHROPLASTY ANTERIOR APPROACH (Left: Hip)     Patient location during evaluation: PACU Anesthesia Type: General Level of consciousness: awake and alert Pain management: pain level controlled Vital Signs Assessment: post-procedure vital signs reviewed and stable Respiratory status: spontaneous breathing, nonlabored ventilation and respiratory function stable Cardiovascular status: blood pressure returned to baseline and stable Postop Assessment: no apparent nausea or vomiting Anesthetic complications: no   No notable events documented.  Last Vitals:  Vitals:   06/05/22 1845 06/05/22 1855  BP: (!) 117/52 110/72  Pulse: 84 83  Resp: 20 18  Temp: 36.7 C   SpO2: 96% 93%    Last Pain:  Vitals:   06/05/22 1855  TempSrc:   PainSc: 0-No pain                 Lynda Rainwater

## 2022-06-06 DIAGNOSIS — S72002A Fracture of unspecified part of neck of left femur, initial encounter for closed fracture: Secondary | ICD-10-CM | POA: Diagnosis not present

## 2022-06-06 LAB — GLUCOSE, CAPILLARY
Glucose-Capillary: 119 mg/dL — ABNORMAL HIGH (ref 70–99)
Glucose-Capillary: 133 mg/dL — ABNORMAL HIGH (ref 70–99)
Glucose-Capillary: 142 mg/dL — ABNORMAL HIGH (ref 70–99)
Glucose-Capillary: 152 mg/dL — ABNORMAL HIGH (ref 70–99)

## 2022-06-06 LAB — HEMOGLOBIN A1C
Hgb A1c MFr Bld: 5.7 % — ABNORMAL HIGH (ref 4.8–5.6)
Mean Plasma Glucose: 117 mg/dL

## 2022-06-06 MED ORDER — SODIUM CHLORIDE 0.9 % IV SOLN: INTRAVENOUS | Status: DC

## 2022-06-06 NOTE — Plan of Care (Signed)
  Problem: Nutritional: Goal: Maintenance of adequate nutrition will improve Outcome: Progressing   Problem: Activity: Goal: Risk for activity intolerance will decrease Outcome: Progressing   

## 2022-06-06 NOTE — TOC Initial Note (Deleted)
Transition of Care [facility]) - Initial/Assessment Note    Patient Details  Name: Alexis Cox MRN: [id number] Date of Birth: 1945/09/07  [facility]) CM/SW Contact:    Rodney Booze, LCSW Phone Number: 06/06/2022, 2:54 PM  Clinical Narrative:                 CSW spoke with Karen/ patient's daughter Santiago Glad reported living in [place] and wanting her mom close to home. The daughter has requested [facility] as they have had the patient their before. This CSW has explained in detail the SNF process to the daughter and has sent the patient out to facilities in the surrounding area of where the patient resides. AT this time FL2 was faxed out. AT this time TOC will continue to follow and update as they come.   Expected Discharge Plan: Skilled Nursing Facility Barriers to Discharge: No Barriers Identified   Patient Goals and CMS Choice Patient states their goals for this hospitalization and ongoing recovery are:: Get better with base line ADL CMS Medicare.gov Compare Post Acute Care list provided to::  (Daughter) Choice offered to / list presented to : Adult [place] ownership interest in [facility].provided to:: Adult Children    Expected Discharge Plan and Services In-house Referral: Clinical Social Work   Post Acute Care Choice: [place] Living arrangements for the past 2 months: [place]                 DME Arranged: N/A DME Agency: NA                  Prior Living Arrangements/Services Living arrangements for the past 2 months: Single Family Home Lives with:: Adult Children Patient language and need for interpreter reviewed:: No Do you feel safe going back to the place where you live?: Yes      Need for Family Participation in Patient Care: No (Comment) Care giver support system in place?: Yes (comment)   Criminal Activity/Legal Involvement Pertinent to Current Situation/Hospitalization: No - Comment  as needed  Activities of Daily Living Home Assistive Devices/Equipment: Cane (specify quad or straight), Eyeglasses ADL Screening (condition at time of admission) Patient's cognitive ability adequate to safely complete daily activities?: Yes Is the patient deaf or have difficulty hearing?: Yes Does the patient have difficulty seeing, even when wearing glasses/contacts?: No Does the patient have difficulty concentrating, remembering, or making decisions?: Yes (sometimes) Patient able to express need for assistance with ADLs?: Yes Does the patient have difficulty dressing or bathing?: No Independently performs ADLs?: Yes (appropriate for developmental age) Does the patient have difficulty walking or climbing stairs?: Yes Weakness of Legs: None Weakness of Arms/Hands: None  Permission Sought/Granted Permission sought to share information with : Family Supports Permission granted to share information with : Yes, Verbal Permission Granted  Share Information with NAME: Robinette Haines     Permission granted to share info w Relationship: daughter  Permission granted to share info w Contact Information: [phone]  Emotional Assessment Appearance:: Appears stated age Attitude/Demeanor/Rapport: Gracious Affect (typically observed): Accepting Orientation: : Oriented to Self, Oriented to Place, Oriented to  Time, Oriented to Situation Alcohol / Substance Use: Not Applicable Psych Involvement: No (comment)  Admission diagnosis:  Fall, initial encounter [W19.XXXA] Closed displaced fracture of left femoral neck (HCC) [S72.002A] Closed fracture of left femur, unspecified fracture morphology, unspecified portion of femur, initial encounter [facility]) [S72.92XA] Patient Active Problem List   Diagnosis Date Noted  Essential hypertension 06/04/2022   Closed displaced fracture of left femoral neck ([place]) 06/04/2022   Normocytic anemia 06/04/2022   Controlled type 2 diabetes mellitus without  complication, without long-term current use of insulin ([place]) 06/04/2022   GERD (gastroesophageal reflux disease) 06/04/2022   CKD (chronic kidney disease) stage 2, GFR 60-89 ml/min 06/04/2022   Anxiety 06/04/2022   Prolonged QT interval 03/03/2021   Impaired glucose tolerance 03/03/2021   Other cirrhosis of liver ([place]) 08/10/2017   PCP:  Leonides Sake, MD Pharmacy:   [facility] [place], [place] - [address] [place] [address] Phone: [phone] Fax: [phone]     Social Determinants of Health ([place]) Social History: SDOH Screenings   Food Insecurity: No Food Insecurity (06/04/2022)  Housing: Low Risk  (06/04/2022)  Transportation Needs: No Transportation Needs (06/04/2022)  Utilities: Not At Risk (06/04/2022)  Tobacco Use: Low Risk  (06/04/2022)   SDOH Interventions: Food Insecurity Interventions: Intervention Not Indicated Housing Interventions: Intervention Not Indicated Transportation Interventions: Intervention Not Indicated Utilities Interventions: Intervention Not Indicated   Readmission Risk Interventions    06/05/2022    1:31 PM  Readmission Risk Prevention Plan  Post Dischage Appt Complete  Medication Screening Complete  Transportation Screening Complete

## 2022-06-06 NOTE — Evaluation (Signed)
Physical Therapy Evaluation Patient Details Name: Alexis Cox MRN: [id number] DOB: [birth year] Today's Date: 06/06/2022  History of Present Illness  77 yo female admitted after fall while out shopping.Left hip xray:Transcervical left femoral fracture with foreshortening and apex  lateral angulation. ortho consulted. pt underwent L THA, AA on 06/04/21 per Dr. Ninfa Linden.  PMH: cirrhosis, HTN, R DA THA 2* fx 2022  Clinical Impression  Pt admitted with above diagnosis.  Pt reports independence at her baseline, able to amb short distance limited by some mild nausea and pain. Pt will benefit from SNF to maximize mobility and prevent falls. Will continue PT in acute setting.  Pt currently with functional limitations due to the deficits listed below (see PT Problem List). Pt will benefit from skilled PT to increase their independence and safety with mobility to allow discharge to the venue listed below.          Recommendations for follow up therapy are one component of a multi-disciplinary discharge planning process, led by the attending physician.  Recommendations may be updated based on patient status, additional functional criteria and insurance authorization.  Follow Up Recommendations Skilled nursing-short term rehab (<3 hours/day)      Assistance Recommended at Discharge Frequent or constant Supervision/Assistance  Patient can return home with the following  A little help with walking and/or transfers;A little help with bathing/dressing/bathroom;Assistance with cooking/housework;Assist for transportation;Help with stairs or ramp for entrance    Equipment Recommendations None recommended by PT  Recommendations for Other Services       Functional Status Assessment Patient has had a recent decline in their functional status and demonstrates the ability to make significant improvements in function in a reasonable and predictable amount of time.     Precautions / Restrictions  Precautions Precautions: Fall Restrictions Weight Bearing Restrictions: No Other Position/Activity Restrictions: WBAT      Mobility  Bed Mobility Overal bed mobility: Needs Assistance Bed Mobility: Supine to Sit     Supine to sit: Min assist     General bed mobility comments: assist with LLE, incr time    Transfers Overall transfer level: Needs assistance Equipment used: Rolling walker (2 wheels) Transfers: Sit to/from Stand Sit to Stand: Min guard, Min assist           General transfer comment: cues for hand placement and LLE position    Ambulation/Gait Ambulation/Gait assistance: Min assist, Min guard Gait Distance (Feet): 12 Feet Assistive device: Rolling walker (2 wheels) Gait Pattern/deviations: Step-to pattern       General Gait Details: verbal cues for sequence and RW position, use of UEs to offload LLE as needed for pain control  Stairs            Wheelchair Mobility    Modified Rankin (Stroke Patients Only)       Balance Overall balance assessment: Needs assistance Sitting-balance support: Single extremity supported, No upper extremity supported, Feet supported Sitting balance-Leahy Scale: Fair     Standing balance support: Reliant on assistive device for balance, During functional activity Standing balance-Leahy Scale: Poor                               Pertinent Vitals/Pain Pain Assessment Pain Assessment: 0-10 Pain Score: 4  Pain Location: L hip Pain Descriptors / Indicators: Grimacing, Sore Pain Intervention(s): Limited activity within patient's tolerance, Monitored during session, Premedicated before session, Repositioned    Home Living Family/patient expects to be discharged to::  Skilled nursing facility Living Arrangements: Spouse/significant other;Children Available Help at Discharge: Family;Available 24 hours/day Type of Home: Mobile home Home Access: Stairs to enter Entrance Stairs-Rails: None Entrance  Stairs-Number of Steps: 2   Home Layout: One level Home Equipment: Conservation officer, nature (2 wheels);BSC/3in1      Prior Function Prior Level of Function : Independent/Modified Independent;History of Falls (last six months)                     Hand Dominance        Extremity/Trunk Assessment   Upper Extremity Assessment Upper Extremity Assessment: Overall WFL for tasks assessed    Lower Extremity Assessment Lower Extremity Assessment: LLE deficits/detail LLE Deficits / Details: ankle WFL, knee and hip grossly 2+/5, limited by post op pain/weakness       Communication   Communication: No difficulties  Cognition Arousal/Alertness: Awake/alert Behavior During Therapy: WFL for tasks assessed/performed Overall Cognitive Status: Within Functional Limits for tasks assessed                                          General Comments      Exercises     Assessment/Plan    PT Assessment Patient needs continued PT services  PT Problem List Decreased strength;Decreased range of motion;Decreased activity tolerance;Decreased mobility;Pain;Decreased knowledge of precautions;Decreased balance       PT Treatment Interventions DME instruction;Therapeutic exercise;Gait training;Functional mobility training;Therapeutic activities;Patient/family education    PT Goals (Current goals can be found in the Care Plan section)  Acute Rehab PT Goals Patient Stated Goal: go to rehab PT Goal Formulation: With patient Time For Goal Achievement: 06/20/22 Potential to Achieve Goals: Good    Frequency Min 3X/week     Co-evaluation               AM-PAC PT "6 Clicks" Mobility  Outcome Measure Help needed turning from your back to your side while in a flat bed without using bedrails?: A Little Help needed moving from lying on your back to sitting on the side of a flat bed without using bedrails?: A Little Help needed moving to and from a bed to a chair (including a  wheelchair)?: A Little Help needed standing up from a chair using your arms (e.g., wheelchair or bedside chair)?: A Little Help needed to walk in hospital room?: A Little Help needed climbing 3-5 steps with a railing? : A Lot 6 Click Score: 17    End of Session Equipment Utilized During Treatment: Gait belt Activity Tolerance: Patient tolerated treatment well Patient left: with call bell/phone within reach;in chair;with chair alarm set Nurse Communication: Mobility status PT Visit Diagnosis: Other abnormalities of gait and mobility (R26.89);Difficulty in walking, not elsewhere classified (R26.2)    Time: [id number] PT Time Calculation (min) (ACUTE ONLY): 22 min   Charges:   PT Evaluation $PT Eval Low Complexity: [place], PT  Acute Rehab Dept [facility]) [phone]  [facility] Weekend Pager [facility] only)  [phone]  06/06/2022   [facility] 06/06/2022, 1:22 PM

## 2022-06-06 NOTE — Discharge Instructions (Signed)

## 2022-06-06 NOTE — Progress Notes (Signed)
Subjective: 1 Day Post-Op Procedure(s) (LRB): TOTAL HIP ARTHROPLASTY ANTERIOR APPROACH (Left) Patient reports pain as moderate.  Awake and alert.  Vitals stable.  Acute blood loss anemia from her fracture and surgery.  Objective: Vital signs in last 24 hours: Temp:  [96.6 F (35.9 C)-98.4 F (36.9 C)] 98.4 F (36.9 C) (03/02 1050) Pulse Rate:  [60-84] 79 (03/02 1050) Resp:  [14-20] 14 (03/02 1050) BP: (91-146)/(52-88) 105/52 (03/02 1050) SpO2:  [89 %-100 %] 89 % (03/02 1050)  Intake/Output from previous day: 03/01 0701 - 03/02 0700 In: 3700 [P.O.:200; I.V.:2800; IV Piggyback:700] Out: 525 [Urine:175; Blood:350] Intake/Output this shift: No intake/output data recorded.  Recent Labs    06/04/22 1300 06/05/22 0418 06/05/22 2001  HGB 9.9* 9.6* 8.8*   Recent Labs    06/05/22 0418 06/05/22 2001  WBC 5.9 7.4  RBC 3.76* 3.38*  HCT 32.0* 29.0*  PLT 132* 139*   Recent Labs    06/04/22 1300 06/05/22 0418  NA 138 137  K 3.8 3.8  CL 103 104  CO2 22 25  BUN 12 15  CREATININE 0.85 0.70  GLUCOSE 137* 137*  CALCIUM 8.6* 8.7*   Recent Labs    06/04/22 1919  INR 1.2    Sensation intact distally Intact pulses distally Dorsiflexion/Plantar flexion intact Incision: dressing C/D/I   Assessment/Plan: 1 Day Post-Op Procedure(s) (LRB): TOTAL HIP ARTHROPLASTY ANTERIOR APPROACH (Left) Up with therapy May need short-term skilled nursing depending on her progress with therapy.     Mcarthur Rossetti 06/06/2022, 11:21 AM

## 2022-06-06 NOTE — Progress Notes (Signed)
[facility] PROGRESS NOTE  Alexis Cox (DOB: 1945-06-05) [id number] PCP: Leonides Sake, MD  Brief Narrative: Alexis Cox is a 77 y.o. female with a history of cirrhosis, T2DM, HTN, HLD, stage II CKD, GERD, depression, right THA Nov 2022 after mechanical fall who presented to the ED on 06/04/2022 after tripping and falling on the left hip. VSS. X-ray confirmed a transcervical left femoral fracture with foreshortening and apex lateral angulation. She was admitted to [facility] and underwent anterior left hip arthroplasty 3/1 by Dr. Ninfa Linden. Plan is to pursue SNF rehabilitation.  Subjective: No chest pain or dyspnea or lightheadedness. Pain is well controlled. Family at bedside. Pt thinks she needs rehabilitation.   Objective: BP (!) 98/50 (BP Location: Left Arm)   Pulse 85   Temp 99 F (37.2 C) (Oral)   Resp 16   Ht '5\' 1"'$  (1.549 m)   Wt 69.2 kg   SpO2 95%   BMI 28.83 kg/m   Gen: Pleasant older female in no distress Pulm: Clear, nonlabored  CV: RRR, +systolic murmur, no rub or gallop. No edema GI: Soft, NT, ND, +BS  Neuro: Alert and oriented. No new focal deficits. Ext: Warm, no deformities. Skin: Left anterolateral hip incision with c/d/I surgical dressing, no ecchymoses or erythema, appropriately modestly tender to palpation. No other rashes, lesions or ulcers on visualized skin   Assessment & Plan: Mechanical fall with closed, displaced left hip fracture: s/p left direct anterior total hip arthroplasty 3/1 by Dr. Ninfa Linden. Vitamin D sufficient at 43.72. - TOC consulted for SNF placement per PT recommendations.  - ASA '81mg'$  BID VTE ppx. - WBAT, no hip restrictions or precautions per ortho.   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 Unable to see PCP or GI records. No available imaging. TBili 1.4, INR 1.2. Thrombocytopenia noted. No evidence of decompensation currently. - Monitor LFTs, bili, and platelets.   Normocytic anemia: Panel is wnl. Hgb down to  8.8g/dl. - With reported hypotension and anemia postoperatively, monitor CBC serially.  - Continue ASA for now (platelets stable at 139k)  Diet-controlled T2DM: HbA1c 5.7%. - SSI  HTN: BP soft - Give IVF, hold propranolol.  HLD:  - Continue zetia.  Stage II CKD:  - Avoid nephrotoxins, monitor BMP in AM, avoid hypotension  GERD:  - PPI  Depression/anxiety:  - Continue SSRI   Dysuria: UA negative.  Patrecia Pour, MD [facility] [email or web address] 06/06/2022, 2:32 PM

## 2022-06-06 NOTE — NC FL2 (Signed)
[place] LEVEL OF CARE FORM     IDENTIFICATION  Patient Name: Alexis Cox Birthdate: [birth year] Sex: female Admission Date (Current Location): 06/04/2022  [facility] and [place] Number:  Herbalist and Address:  [facility],  [place], [place]      Provider Number: [id number]  Attending Physician Name and Address:  Patrecia Pour, MD  Relative Name and Phone Number:  Robinette Haines [phone]    Current Level of Care: Hospital Recommended Level of Care: [place] Prior Approval Number:    Date Approved/Denied: 06/06/22 PASRR Number: [id number]  Discharge Plan: SNF    Current Diagnoses: Patient Active Problem List   Diagnosis Date Noted   Essential hypertension 06/04/2022   Closed displaced fracture of left femoral neck ([place]) 06/04/2022   Normocytic anemia 06/04/2022   Controlled type 2 diabetes mellitus without complication, without long-term current use of insulin ([place]) 06/04/2022   GERD (gastroesophageal reflux disease) 06/04/2022   CKD (chronic kidney disease) stage 2, GFR 60-89 ml/min 06/04/2022   Anxiety 06/04/2022   Prolonged QT interval 03/03/2021   Impaired glucose tolerance 03/03/2021   Other cirrhosis of liver ([place]) 08/10/2017    Orientation RESPIRATION BLADDER Height & Weight     Self, Time, Situation, Place  Normal Continent Weight: 152 lb 8.9 oz (69.2 kg) Height:  '5\' 1"'$  (154.9 cm)  BEHAVIORAL SYMPTOMS/MOOD NEUROLOGICAL BOWEL NUTRITION STATUS      Continent    AMBULATORY STATUS COMMUNICATION OF NEEDS Skin   Limited Assist Verbally Normal                       Personal Care Assistance Level of Assistance  Bathing, Feeding, Dressing, Total care Bathing Assistance: Limited assistance Feeding assistance: Limited assistance Dressing Assistance: Limited assistance Total Care Assistance: Limited assistance   Functional Limitations Info  Sight, Hearing, Speech Sight Info:  Adequate Hearing Info: Adequate Speech Info: Adequate    SPECIAL CARE FACTORS FREQUENCY                       Contractures Contractures Info: Not present    Additional Factors Info  Code Status, Allergies Code Status Info: Full Allergies Info: NA           Current Medications (06/06/2022):  This is the current hospital active medication list Current Facility-Administered Medications  Medication Dose Route Frequency Provider Last Rate Last Admin   0.9 %  sodium chloride infusion   Intravenous Continuous Patrecia Pour, MD       acetaminophen (TYLENOL) tablet 325-650 mg  325-650 mg Oral Q6H PRN Mcarthur Rossetti, MD   650 mg at 06/06/22 0940   alum & mag hydroxide-simeth (MAALOX/MYLANTA) 200-200-20 MG/5ML suspension 30 mL  30 mL Oral Q4H PRN Mcarthur Rossetti, MD       aspirin chewable tablet 81 mg  81 mg Oral BID Mcarthur Rossetti, MD   81 mg at 06/06/22 0941   diphenhydrAMINE (BENADRYL) 12.5 MG/5ML elixir 12.5-25 mg  12.5-25 mg Oral Q4H PRN Mcarthur Rossetti, MD   25 mg at 06/05/22 1911   docusate sodium (COLACE) capsule 100 mg  100 mg Oral BID Mcarthur Rossetti, MD   100 mg at 06/06/22 0940   ezetimibe (ZETIA) tablet 10 mg  10 mg Oral Daily Mcarthur Rossetti, MD   10 mg at 06/06/22 0940   FLUoxetine (PROZAC) capsule 20 mg  20 mg Oral  Daily Mcarthur Rossetti, MD   20 mg at 06/06/22 0940   HYDROcodone-acetaminophen (NORCO/VICODIN) 5-325 MG per tablet 1-2 tablet  1-2 tablet Oral Q6H PRN Mcarthur Rossetti, MD   1 tablet at 06/05/22 2214   HYDROmorphone (DILAUDID) injection 0.5-1 mg  0.5-1 mg Intravenous Q4H PRN Mcarthur Rossetti, MD       insulin aspart (novoLOG) injection 0-9 Units  0-9 Units Subcutaneous TID WC Mcarthur Rossetti, MD   1 Units at 06/05/22 0919   menthol-cetylpyridinium (CEPACOL) lozenge 3 mg  1 lozenge Oral PRN Mcarthur Rossetti, MD       Or   phenol (CHLORASEPTIC) mouth spray 1 spray  1 spray  Mouth/Throat PRN Mcarthur Rossetti, MD       methocarbamol (ROBAXIN) tablet 500 mg  500 mg Oral Q6H PRN Mcarthur Rossetti, MD   500 mg at 06/05/22 0159   Or   methocarbamol (ROBAXIN) 500 mg in dextrose 5 % 50 mL IVPB  500 mg Intravenous Q6H PRN Mcarthur Rossetti, MD       methocarbamol (ROBAXIN) tablet 500 mg  500 mg Oral Q6H PRN Mcarthur Rossetti, MD       Or   methocarbamol (ROBAXIN) 500 mg in dextrose 5 % 50 mL IVPB  500 mg Intravenous Q6H PRN Mcarthur Rossetti, MD       metoCLOPramide (REGLAN) tablet 5-10 mg  5-10 mg Oral Q8H PRN Mcarthur Rossetti, MD       Or   metoCLOPramide (REGLAN) injection 5-10 mg  5-10 mg Intravenous Q8H PRN Mcarthur Rossetti, MD       morphine (PF) 2 MG/ML injection 0.5 mg  0.5 mg Intravenous Q2H PRN Mcarthur Rossetti, MD   0.5 mg at 06/05/22 1206   ondansetron (ZOFRAN) tablet 4 mg  4 mg Oral Q6H PRN Mcarthur Rossetti, MD       Or   ondansetron [facility]) injection 4 mg  4 mg Intravenous Q6H PRN Mcarthur Rossetti, MD       oxyCODONE (Oxy IR/ROXICODONE) immediate release tablet 10-15 mg  10-15 mg Oral Q4H PRN Mcarthur Rossetti, MD       oxyCODONE (Oxy IR/ROXICODONE) immediate release tablet 5-10 mg  5-10 mg Oral Q4H PRN Mcarthur Rossetti, MD       pantoprazole (PROTONIX) EC tablet 40 mg  40 mg Oral BID Mcarthur Rossetti, MD   40 mg at 06/06/22 0940   senna-docusate (Senokot-S) tablet 1 tablet  1 tablet Oral QHS PRN Mcarthur Rossetti, MD         Discharge Medications: Please see discharge summary for a list of discharge medications.  Relevant Imaging Results:  Relevant Lab Results:   Additional Information [place] LCSWA [id number]  Rodney Booze, LCSW

## 2022-06-07 DIAGNOSIS — S72002A Fracture of unspecified part of neck of left femur, initial encounter for closed fracture: Secondary | ICD-10-CM | POA: Diagnosis not present

## 2022-06-07 DIAGNOSIS — E119 Type 2 diabetes mellitus without complications: Secondary | ICD-10-CM | POA: Diagnosis not present

## 2022-06-07 LAB — BASIC METABOLIC PANEL
Anion gap: 8 (ref 5–15)
BUN: 30 mg/dL — ABNORMAL HIGH (ref 8–23)
CO2: 26 mmol/L (ref 22–32)
Calcium: 8.1 mg/dL — ABNORMAL LOW (ref 8.9–10.3)
Chloride: 103 mmol/L (ref 98–111)
Creatinine, Ser: 1.04 mg/dL — ABNORMAL HIGH (ref 0.44–1.00)
GFR, Estimated: 56 mL/min — ABNORMAL LOW (ref >60)
Glucose, Bld: 134 mg/dL — ABNORMAL HIGH (ref 70–99)
Potassium: 3.5 mmol/L (ref 3.5–5.1)
Sodium: 137 mmol/L (ref 135–145)

## 2022-06-07 LAB — CBC
HCT: 22.8 % — ABNORMAL LOW (ref 36.0–46.0)
Hemoglobin: 6.9 g/dL — CL (ref 12.0–15.0)
MCH: 25.9 pg — ABNORMAL LOW (ref 26.0–34.0)
MCHC: 30.3 g/dL (ref 30.0–36.0)
MCV: 85.7 fL (ref 80.0–100.0)
Platelets: 110 10*3/uL — ABNORMAL LOW (ref 150–400)
RBC: 2.66 MIL/uL — ABNORMAL LOW (ref 3.87–5.11)
RDW: 16.9 % — ABNORMAL HIGH (ref 11.5–15.5)
WBC: 5 10*3/uL (ref 4.0–10.5)
nRBC: 0 % (ref 0.0–0.2)

## 2022-06-07 LAB — HEMOGLOBIN AND HEMATOCRIT, BLOOD
HCT: 27.3 % — ABNORMAL LOW (ref 36.0–46.0)
Hemoglobin: 8.4 g/dL — ABNORMAL LOW (ref 12.0–15.0)

## 2022-06-07 LAB — GLUCOSE, CAPILLARY
Glucose-Capillary: 109 mg/dL — ABNORMAL HIGH (ref 70–99)
Glucose-Capillary: 170 mg/dL — ABNORMAL HIGH (ref 70–99)
Glucose-Capillary: 134 mg/dL — ABNORMAL HIGH (ref 70–99)
Glucose-Capillary: 143 mg/dL — ABNORMAL HIGH (ref 70–99)

## 2022-06-07 LAB — PREPARE RBC (CROSSMATCH)

## 2022-06-07 MED ORDER — POTASSIUM CHLORIDE IN NACL 20-0.45 MEQ/L-% IV SOLN
INTRAVENOUS | Status: AC
  Filled 2022-06-07: qty 1000

## 2022-06-07 MED ORDER — SODIUM CHLORIDE 0.9% IV SOLUTION: Freq: Once | INTRAVENOUS | Status: AC

## 2022-06-07 NOTE — Progress Notes (Signed)
   06/07/22 0445  Provider Notification  Provider Name/Title J. Olena Heckle NP  Date Provider Notified 06/07/22  Time Provider Notified [phone]  Method of Notification Page (secure chat)  Notification Reason Critical Result (Hgb)  Test performed and critical result 6.9  Date Critical Result Received 06/07/22  Time Critical Result Received 0425  Provider response Other (Comment) (await)

## 2022-06-07 NOTE — Plan of Care (Signed)
  Problem: Health Behavior/Discharge Planning: Goal: Ability to identify and utilize available resources and services will improve Outcome: Progressing Goal: Ability to manage health-related needs will improve Outcome: Progressing   Problem: Education: Goal: Knowledge of General Education information will improve Description: Including pain rating scale, medication(s)/side effects and non-pharmacologic comfort measures Outcome: Progressing   Problem: Activity: Goal: Risk for activity intolerance will decrease Outcome: Progressing   Problem: Pain Managment: Goal: General experience of comfort will improve Outcome: Progressing

## 2022-06-07 NOTE — Plan of Care (Signed)
  Problem: Education: Goal: Knowledge of General Education information will improve Description Including pain rating scale, medication(s)/side effects and non-pharmacologic comfort measures Outcome: Progressing   

## 2022-06-07 NOTE — Progress Notes (Signed)
[facility] PROGRESS NOTE  Alexis Cox (DOB: 09/03/1945) [id number] PCP: Leonides Sake, MD  Brief Narrative: Alexis Cox is a 77 y.o. female with a history of cirrhosis, T2DM, HTN, HLD, stage II CKD, GERD, depression, right THA Nov 2022 after mechanical fall who presented to the ED on 06/04/2022 after tripping and falling on the left hip. VSS. X-ray confirmed a transcervical left femoral fracture with foreshortening and apex lateral angulation. She was admitted to [facility] and underwent anterior left hip arthroplasty 3/1 by Dr. Ninfa Linden. Plan is to pursue SNF rehabilitation.  Subjective: Denies any bleeding, pain is controlled. No chest pain, dyspnea or lightheadedness. Has had transfusions in the past, no history of reaction, consents to a unit now. Seems irritated that I interrupted her phone conversation.  Objective: BP 131/66 (BP Location: Left Arm)   Pulse 81   Temp (!) 100.4 F (38 C) (Oral)   Resp 18   Ht '5\' 1"'$  (1.549 m)   Wt 69.2 kg   SpO2 99%   BMI 28.83 kg/m   Gen: Elderly female in no distress Pulm: Clear, nonlabored  CV: RRR, no MRG GI: Soft, NT, ND, +BS Neuro: Alert and oriented. No new focal deficits. Ext: Warm, no deformities. Skin: Pale no jaundice. Left lateral thig/hip surgical dressing with small hemostatic blood spot, no ecchymosis, discharge or erythema. Some induration dependently that is modestly tender, no fluctuance. Compartment soft and distally NVI.  Assessment & Plan: Mechanical fall with closed, displaced left hip fracture: s/p left direct anterior total hip arthroplasty 3/1 by Dr. Ninfa Linden. Vitamin D sufficient at 43.72. - TOC consulted for SNF placement per PT recommendations. Anticipate stability for discharge 3/4.  - ASA '81mg'$  BID VTE ppx. - WBAT, no hip restrictions or precautions per ortho.   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 Unable to see PCP or GI records. No available imaging. TBili 1.4, INR 1.2. Thrombocytopenia  noted. No evidence of decompensation currently. - Monitor LFTs, bili, and platelets.   ABLA on chronic normocytic anemia: Panel is wnl. Hgb down to 6.9g/dl postoperatively, 1u PRBCs ordered 3/3.  - Monitor CBC serially. No current symptoms or active bleeding.  - Continue ASA for now (platelets remain > 100k)  Diet-controlled T2DM: HbA1c 5.7%. - SSI  HTN: BP has improved.  - Continue IVF, hold propranolol.   HLD:  - Continue zetia.  AKI on sage II CKD: Hemodynamically mediated postoperative hypotension.  - Continue monitoring with maintenance IVF, avoid nephrotoxins/contrast/hypotension.    GERD:  - PPI  Depression/anxiety:  - Continue SSRI   Dysuria: UA negative.  Patrecia Pour, MD [facility] [email or web address] 06/07/2022, 9:49 AM

## 2022-06-07 NOTE — TOC Progression Note (Signed)
Transition of Care [facility]) - Progression Note    Patient Details  Name: LABRIA SEAMANS MRN: [id number] Date of Birth: [birth year]  [facility]) CM/SW Contact  Rodney Booze, [place] Phone Number: 06/07/2022, 9:10 AM  Clinical Narrative:    CSW spoke to the patient's daughter on 3/2 about sending the patient out to SNF's the daughter would like [facility]. TOC will continue to follow and update at this time the facility has not accepted or decline pending bed offers.   Expected Discharge Plan: [place] Barriers to Discharge: No Barriers Identified  Expected Discharge Plan and Services In-house Referral: Clinical Social Work   Post Acute Care Choice: [place] Living arrangements for the past 2 months: Single Family Home                 DME Arranged: N/A DME Agency: NA                   Social Determinants of Health (SDOH) Interventions SDOH Screenings   Food Insecurity: No Food Insecurity (06/04/2022)  Housing: Low Risk  (06/04/2022)  Transportation Needs: No Transportation Needs (06/04/2022)  Utilities: Not At Risk (06/04/2022)  Tobacco Use: Low Risk  (06/04/2022)    Readmission Risk Interventions    06/05/2022    1:31 PM  Readmission Risk Prevention Plan  Post Dischage Appt Complete  Medication Screening Complete  Transportation Screening Complete

## 2022-06-07 NOTE — TOC Progression Note (Signed)
Transition of Care [facility]) - Progression Note    Patient Details  Name: DEVIAN CRIGLER MRN: [id number] Date of Birth: 1945/04/22  Transition of Care [facility]) CM/SW Contact  Rodney Booze, [place] Phone Number: 06/07/2022, 12:57 PM  Clinical Narrative:    CSW reached out to [facility] to see if anyone could review the patient. At this time there is no one available CSW was told to call back in AM and speak with carol. TOC will follow up in the AM.    Expected Discharge Plan: [place] Barriers to Discharge: No Barriers Identified  Expected Discharge Plan and Services In-house Referral: Clinical Social Work   Post Acute Care Choice: [place] Living arrangements for the past 2 months: Single Family Home                 DME Arranged: N/A DME Agency: NA                   Social Determinants of Health (SDOH) Interventions SDOH Screenings   Food Insecurity: No Food Insecurity (06/04/2022)  Housing: Low Risk  (06/04/2022)  Transportation Needs: No Transportation Needs (06/04/2022)  Utilities: Not At Risk (06/04/2022)  Tobacco Use: Low Risk  (06/04/2022)    Readmission Risk Interventions    06/05/2022    1:31 PM  Readmission Risk Prevention Plan  Post Dischage Appt Complete  Medication Screening Complete  Transportation Screening Complete

## 2022-06-07 NOTE — Progress Notes (Signed)
Notification that unit of [facility] is ready for transfusion.  Will pass this information to day RN Colletta [place] during report.

## 2022-06-08 ENCOUNTER — Encounter (HOSPITAL_COMMUNITY): Payer: Self-pay | Admitting: Orthopaedic Surgery

## 2022-06-08 DIAGNOSIS — Z96642 Presence of left artificial hip joint: Secondary | ICD-10-CM | POA: Diagnosis not present

## 2022-06-08 DIAGNOSIS — M6281 Muscle weakness (generalized): Secondary | ICD-10-CM | POA: Diagnosis not present

## 2022-06-08 DIAGNOSIS — S72002D Fracture of unspecified part of neck of left femur, subsequent encounter for closed fracture with routine healing: Secondary | ICD-10-CM | POA: Diagnosis not present

## 2022-06-08 DIAGNOSIS — R2689 Other abnormalities of gait and mobility: Secondary | ICD-10-CM | POA: Diagnosis not present

## 2022-06-08 DIAGNOSIS — K760 Fatty (change of) liver, not elsewhere classified: Secondary | ICD-10-CM | POA: Diagnosis not present

## 2022-06-08 DIAGNOSIS — S72002A Fracture of unspecified part of neck of left femur, initial encounter for closed fracture: Secondary | ICD-10-CM | POA: Diagnosis not present

## 2022-06-08 DIAGNOSIS — E119 Type 2 diabetes mellitus without complications: Secondary | ICD-10-CM | POA: Diagnosis not present

## 2022-06-08 DIAGNOSIS — Z4789 Encounter for other orthopedic aftercare: Secondary | ICD-10-CM | POA: Diagnosis not present

## 2022-06-08 DIAGNOSIS — R29898 Other symptoms and signs involving the musculoskeletal system: Secondary | ICD-10-CM | POA: Diagnosis not present

## 2022-06-08 DIAGNOSIS — Z96641 Presence of right artificial hip joint: Secondary | ICD-10-CM | POA: Diagnosis not present

## 2022-06-08 DIAGNOSIS — Z9181 History of falling: Secondary | ICD-10-CM | POA: Diagnosis not present

## 2022-06-08 DIAGNOSIS — Z7401 Bed confinement status: Secondary | ICD-10-CM | POA: Diagnosis not present

## 2022-06-08 DIAGNOSIS — Z743 Need for continuous supervision: Secondary | ICD-10-CM | POA: Diagnosis not present

## 2022-06-08 LAB — BASIC METABOLIC PANEL
Anion gap: 9 (ref 5–15)
BUN: 19 mg/dL (ref 8–23)
CO2: 23 mmol/L (ref 22–32)
Calcium: 8 mg/dL — ABNORMAL LOW (ref 8.9–10.3)
Chloride: 107 mmol/L (ref 98–111)
Creatinine, Ser: 0.58 mg/dL (ref 0.44–1.00)
GFR, Estimated: >60 mL/min (ref >60)
Glucose, Bld: 121 mg/dL — ABNORMAL HIGH (ref 70–99)
Potassium: 3.5 mmol/L (ref 3.5–5.1)
Sodium: 139 mmol/L (ref 135–145)

## 2022-06-08 LAB — GLUCOSE, CAPILLARY
Glucose-Capillary: 106 mg/dL — ABNORMAL HIGH (ref 70–99)
Glucose-Capillary: 206 mg/dL — ABNORMAL HIGH (ref 70–99)

## 2022-06-08 LAB — CBC
HCT: 26.9 % — ABNORMAL LOW (ref 36.0–46.0)
Hemoglobin: 8.2 g/dL — ABNORMAL LOW (ref 12.0–15.0)
MCH: 26.6 pg (ref 26.0–34.0)
MCHC: 30.5 g/dL (ref 30.0–36.0)
MCV: 87.3 fL (ref 80.0–100.0)
Platelets: 100 10*3/uL — ABNORMAL LOW (ref 150–400)
RBC: 3.08 MIL/uL — ABNORMAL LOW (ref 3.87–5.11)
RDW: 16.8 % — ABNORMAL HIGH (ref 11.5–15.5)
WBC: 3.5 10*3/uL — ABNORMAL LOW (ref 4.0–10.5)
nRBC: 0 % (ref 0.0–0.2)

## 2022-06-08 LAB — TYPE AND SCREEN
ABO/RH(D): O POS
Antibody Screen: NEGATIVE
Unit division: 0

## 2022-06-08 LAB — BPAM RBC
Blood Product Expiration Date: 202403312359
ISSUE DATE / TIME: 202403031235
Unit Type and Rh: 5100

## 2022-06-08 MED ORDER — ASPIRIN 81 MG PO CHEW: 81.0000 mg | CHEWABLE_TABLET | Freq: Two times a day (BID) | ORAL | 0 refills | Status: DC

## 2022-06-08 MED ORDER — ENSURE ENLIVE PO LIQD
237.0000 mL | Freq: Two times a day (BID) | ORAL | Status: DC
  Administered 2022-06-08: 237 mL via ORAL

## 2022-06-08 MED ORDER — OXYCODONE HCL 5 MG PO TABS: 5.0000 mg | ORAL_TABLET | ORAL | 0 refills | Status: DC | PRN

## 2022-06-08 NOTE — Progress Notes (Signed)
Subjective: 3 Days Post-Op Procedure(s) (LRB): TOTAL HIP ARTHROPLASTY ANTERIOR APPROACH (Left) Patient reports pain as moderate.    Objective: Vital signs in last 24 hours: Temp:  [97.8 F (36.6 C)-99.3 F (37.4 C)] 97.8 F (36.6 C) (03/04 0523) Pulse Rate:  [79-90] 79 (03/04 0523) Resp:  [16-18] 16 (03/04 0523) BP: (120-159)/(55-75) 139/75 (03/04 0523) SpO2:  [91 %-100 %] 98 % (03/04 0523)  Intake/Output from previous day: 03/03 0701 - 03/04 0700 In: 1137.8 [P.O.:540; I.V.:193.8; Blood:404] Out: 1700 [Urine:1700] Intake/Output this shift: No intake/output data recorded.  Recent Labs    06/05/22 2001 06/07/22 0307 06/07/22 1804 06/08/22 0325  HGB 8.8* 6.9* 8.4* 8.2*   Recent Labs    06/07/22 0307 06/07/22 1804 06/08/22 0325  WBC 5.0  --  3.5*  RBC 2.66*  --  3.08*  HCT 22.8* 27.3* 26.9*  PLT 110*  --  100*   Recent Labs    06/07/22 0307 06/08/22 0325  NA 137 139  K 3.5 3.5  CL 103 107  CO2 26 23  BUN 30* 19  CREATININE 1.04* 0.58  GLUCOSE 134* 121*  CALCIUM 8.1* 8.0*   No results for input(s): "LABPT", "INR" in the last 72 hours.  Sensation intact distally Intact pulses distally Dorsiflexion/Plantar flexion intact Incision: scant drainage   Assessment/Plan: 3 Days Post-Op Procedure(s) (LRB): TOTAL HIP ARTHROPLASTY ANTERIOR APPROACH (Left) Up with therapy Ok from Ortho standpoint for going to short-term SNF.     Mcarthur Rossetti 06/08/2022, 7:31 AM

## 2022-06-08 NOTE — Progress Notes (Signed)
Orthopedic Tech Progress Note Patient Details:  Alexis Cox 12/11/1945 [id number]  Patient ID: Alexis Cox, female   DOB: 1945/09/14, 77 y.o.   MRN: [id number] Order received for overhead frame, pt exceeds age limit at this time.    Jusitn Salsgiver OTR/L 06/08/2022, 8:19 AM

## 2022-06-08 NOTE — Discharge Summary (Signed)
Physician Discharge Summary   Patient: Alexis Cox MRN: [id number] DOB: [birth year]  Admit date:     06/04/2022  Discharge date: 06/08/22  Discharge Physician: Patrecia Pour   PCP: Leonides Sake, MD   Recommendations at discharge:  Follow up with orthopedics in 2 weeks.   Discharge Diagnoses: Principal Problem:   Closed displaced fracture of left femoral neck (HCC) Active Problems:   Normocytic anemia   Controlled type 2 diabetes mellitus without complication, without long-term current use of insulin (HCC)   Other cirrhosis of liver (HCC)   Essential hypertension   CKD (chronic kidney disease) stage 2, GFR 60-89 ml/min   GERD (gastroesophageal reflux disease)   Anxiety  Hospital Course: Alexis Cox is a 77 y.o. female with a history of cirrhosis, T2DM, HTN, HLD, stage II CKD, GERD, depression, right THA Nov 2022 after mechanical fall who presented to the ED on 06/04/2022 after tripping and falling on the left hip. VSS. X-ray confirmed a transcervical left femoral fracture with foreshortening and apex lateral angulation. She was admitted to [facility] and underwent anterior left hip arthroplasty 3/1 by Dr. Ninfa Linden. Plan is to pursue SNF rehabilitation.  Assessment and Plan: Mechanical fall with closed, displaced left hip fracture: s/p left direct anterior total hip arthroplasty 3/1 by Dr. Ninfa Linden. Vitamin D sufficient at 43.72. - TOC consulted for SNF placement per PT recommendations. - Follow up with orthopedics in 2 weeks. - ASA '81mg'$  BID VTE ppx. - WBAT, no hip restrictions or precautions per ortho.    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 Unable to see PCP or GI records. No available imaging. TBili 1.4, INR 1.2. Thrombocytopenia noted. No evidence of decompensation currently. - Suggest GI follow up, currently appears compensated.    ABLA on chronic normocytic anemia: Panel is wnl. Hgb down to 6.9g/dl postoperatively, 1u PRBCs ordered 3/3 with  appropriate response up to 8.2g/dl with no evidence of ongoing bleeding or symptoms.   - Monitor CBC. - Continue ASA for now (platelets remain > 100k)   Diet-controlled T2DM: HbA1c 5.7%.   HTN: BP has improved. Ok to restart home propranolol.   HLD:  - Continue zetia.   AKI on sage II CKD: Hemodynamically mediated postoperative hypotension and has improved with supportive care.   GERD:  - PPI   Depression/anxiety:  - Continue SSRI   Dysuria: UA negative, resolved.  Consultants: Orthopedics Procedures performed:  06/05/22 TOTAL HIP ARTHROPLASTY ANTERIOR APPROACH Mcarthur Rossetti, MD  Disposition: Skilled nursing facility Diet recommendation:  Cardiac and Carb modified diet DISCHARGE MEDICATION: Allergies as of 06/08/2022   No Known Allergies      Medication List     TAKE these medications    aspirin 81 MG chewable tablet Chew 1 tablet (81 mg total) by mouth 2 (two) times daily.   ezetimibe 10 MG tablet Commonly known as: ZETIA Take 10 mg by mouth in the morning.   FLUoxetine 20 MG tablet Commonly known as: PROZAC Take 20 mg by mouth daily.   Multivitamin Gummies Adult Chew Chew 3 each by mouth daily.   omeprazole 20 MG capsule Commonly known as: PRILOSEC Take 20 mg by mouth 2 (two) times daily before a meal.   oxyCODONE 5 MG immediate release tablet Commonly known as: Oxy IR/ROXICODONE Take 1-2 tablets (5-10 mg total) by mouth every 4 (four) hours as needed for moderate pain (pain score 4-6).   propranolol 10 MG tablet Commonly known as: INDERAL Take 10 mg  by mouth 2 (two) times daily.               Durable Medical Equipment  (From admission, onward)           Start     Ordered   06/05/22 1857  DME 3 n 1  Once        06/05/22 1856   06/05/22 1857  DME Walker rolling  Once       Question Answer Comment  Walker: With 5 Inch Wheels   Patient needs a walker to treat with the following condition Status post total replacement of left  hip      06/05/22 1856            Contact information for follow-up providers     Mcarthur Rossetti, MD. Schedule an appointment as soon as possible for a visit in 2 week(s).   Specialty: Orthopedic Surgery Contact information: [place] [address] [phone]         HamrickLorin Mercy, MD Follow up.   Specialty: Family Medicine Contact information: [address] [place] [address] [phone]              Contact information for after-discharge care     [facility] .   Service: Skilled Nursing Contact information: [address] [place] [phone]                    Discharge Exam: Danley Danker Weights   06/04/22 2133  Weight: 69.2 kg  BP 139/75 (BP Location: Left Arm)   Pulse 79   Temp 97.8 F (36.6 C) (Oral)   Resp 16   Ht '5\' 1"'$  (1.549 m)   Wt 69.2 kg   SpO2 98%   BMI 28.83 kg/m   Well-appearing elderly female in no distress Clear, nonlabored RRR, no MRG Left thigh dressing c/d/I without surrounding erythema or exudate or ecchymosis. Appropriately mildly tender to palpation, distally NVI  Condition at discharge: stable  The results of significant diagnostics from this hospitalization (including imaging, microbiology, ancillary and laboratory) are listed below for reference.   Imaging Studies: DG Pelvis Portable  Result Date: 06/05/2022 CLINICAL DATA:  Status post left hip arthroplasty EXAM: PORTABLE PELVIS 1-2 VIEWS COMPARISON:  03/04/2021 FINDINGS: There is interval left hip arthroplasty. There is previous right hip arthroplasty. Skin staples are noted along the lateral aspect of left hip. Scattered vascular calcifications are seen. IMPRESSION: Status post left hip arthroplasty. Electronically Signed   By: Elmer Picker M.D.   On: 06/05/2022 18:15   DG HIP UNILAT WITH PELVIS 2-3 VIEWS LEFT  Result Date:  06/05/2022 CLINICAL DATA:  Fluoroscopic assistance for left hip arthroplasty EXAM: DG HIP (WITH OR WITHOUT PELVIS) 2-3V LEFT COMPARISON:  06/04/2022 FINDINGS: There is interval left hip arthroplasty. There is previous right hip arthroplasty. Fluoroscopic time 12 seconds. Radiation dose 3.136 mGy. IMPRESSION: Fluoroscopic assistance was provided for left hip arthroplasty. Electronically Signed   By: Elmer Picker M.D.   On: 06/05/2022 17:27   DG C-Arm 1-60 Min-No Report  Result Date: 06/05/2022 Fluoroscopy was utilized by the requesting physician.  No radiographic interpretation.   DG C-Arm 1-60 Min-No Report  Result Date: 06/05/2022 Fluoroscopy was utilized by the requesting physician.  No radiographic interpretation.   DG Chest 1 View  Result Date: 06/04/2022 CLINICAL DATA:  History of hypertension and prolonged QT interval. Left femoral neck fracture, preoperative assessment  EXAM: CHEST  1 VIEW COMPARISON:  03/03/2021 FINDINGS: Upper normal heart size. Tortuous thoracic aorta. Skin fold along the left lateral chest. The lungs appear clear. No blunting of the costophrenic angles. Atherosclerotic calcification of the aortic arch. IMPRESSION: 1. No active cardiopulmonary disease is radiographically apparent. 2.  Aortic Atherosclerosis (ICD10-I70.0). Electronically Signed   By: Van Clines M.D.   On: 06/04/2022 16:07   DG Hip Unilat W or Wo Pelvis 2-3 Views Left  Result Date: 06/04/2022 CLINICAL DATA:  Mechanical fall with pain to the left hip EXAM: DG HIP (WITH OR WITHOUT PELVIS) 2V LEFT COMPARISON:  Pelvis radiograph dated 03/04/2021 FINDINGS: Transcervical left femoral fracture with foreshortening of the left femur and apex lateral angulation. The femoroacetabular joint remains intact. Postsurgical changes from right hip arthroplasty. Hardware appears intact. IMPRESSION: Transcervical left femoral fracture with foreshortening and apex lateral angulation. Electronically Signed   By: Darrin Nipper M.D.   On: 06/04/2022 13:36    Microbiology: Results for orders placed or performed during the hospital encounter of 06/04/22  Surgical PCR screen     Status: None   Collection Time: 06/04/22  9:36 PM   Specimen: Nasal Mucosa; Nasal Swab  Result Value Ref Range Status   MRSA, PCR NEGATIVE NEGATIVE Final   Staphylococcus aureus NEGATIVE NEGATIVE Final    Comment: (NOTE) The Xpert SA Assay (FDA approved for NASAL specimens in patients 82 years of age and older), is one component of a comprehensive surveillance program. It is not intended to diagnose infection nor to guide or monitor treatment. Performed at [facility], [place] [address], [place], [place] [address]     Labs: CBC: Recent Labs  Lab 06/04/22 1300 06/05/22 0418 06/05/22 2001 06/07/22 0307 06/07/22 1804 06/08/22 0325  WBC 8.1 5.9 7.4 5.0  --  3.5*  HGB 9.9* 9.6* 8.8* 6.9* 8.4* 8.2*  HCT 33.1* 32.0* 29.0* 22.8* 27.3* 26.9*  MCV 86.4 85.1 85.8 85.7  --  87.3  PLT 203 132* 139* 110*  --  123XX123*   Basic Metabolic Panel: Recent Labs  Lab 06/04/22 1300 06/05/22 0418 06/07/22 0307 06/08/22 0325  NA 138 137 137 139  K 3.8 3.8 3.5 3.5  CL 103 104 103 107  CO2 '22 25 26 23  '$ GLUCOSE 137* 137* 134* 121*  BUN 12 15 30* 19  CREATININE 0.85 0.70 1.04* 0.58  CALCIUM 8.6* 8.7* 8.1* 8.0*   Liver Function Tests: Recent Labs  Lab 06/04/22 1919  AST 27  ALT 20  ALKPHOS 85  BILITOT 1.4*  PROT 6.8  ALBUMIN 3.5   CBG: Recent Labs  Lab 06/07/22 1059 06/07/22 1528 06/07/22 1727 06/07/22 2228 06/08/22 0735  GLUCAP 109* 170* 134* 143* 106*    Discharge time spent: greater than 30 minutes.  Signed: Patrecia Pour, MD [facility] 06/08/2022

## 2022-06-08 NOTE — Progress Notes (Signed)
Physical Therapy Treatment Patient Details Name: Alexis Cox MRN: [id number] DOB: 03-Sep-1945 Today's Date: 06/08/2022   History of Present Illness 77 yo female admitted after fall while out shopping.Left hip xray:Transcervical left femoral fracture with foreshortening and apex  lateral angulation. ortho consulted. pt underwent L THA, AA on 06/04/21 per Dr. Ninfa Linden.  PMH: cirrhosis, HTN, R DA THA 2* fx 2022    PT Comments    Pt is cooperative and willing to work with PT. Still reporting hip soreness. Pt tolerated initiation of THA HEP, and short distance amb. Pt experienced LOB x1 needing min assist to recover.  Progressing toward goals however is at risk for falls given rapid fatigue and balance deficits.  Recommend SNF    Recommendations for follow up therapy are one component of a multi-disciplinary discharge planning process, led by the attending physician.  Recommendations may be updated based on patient status, additional functional criteria and insurance authorization.  Follow Up Recommendations  Skilled nursing-short term rehab (<3 hours/day) Can patient physically be transported by private vehicle: Yes   Assistance Recommended at Discharge Frequent or constant Supervision/Assistance  Patient can return home with the following A little help with walking and/or transfers;A little help with bathing/dressing/bathroom;Assistance with cooking/housework;Assist for transportation;Help with stairs or ramp for entrance   Equipment Recommendations  None recommended by PT    Recommendations for Other Services       Precautions / Restrictions Precautions Precautions: Fall Restrictions Weight Bearing Restrictions: No LLE Weight Bearing: Weight bearing as tolerated     Mobility  Bed Mobility Overal bed mobility: Needs Assistance Bed Mobility: Supine to Sit     Supine to sit: Min assist     General bed mobility comments: assist with LLE, incr time and effort; incr time to  transition to EOB, incr time and effort to complete scooting to EOB however pt able to self assist  with cues    Transfers Overall transfer level: Needs assistance Equipment used: Rolling walker (2 wheels) Transfers: Sit to/from Stand Sit to Stand: Min assist           General transfer comment: cues for hand placement and LLE position; assist to power up from EOB    Ambulation/Gait Ambulation/Gait assistance: Min assist, Min guard Gait Distance (Feet): 20 Feet Assistive device: Rolling walker (2 wheels) Gait Pattern/deviations: Step-to pattern Gait velocity: decr     General Gait Details: verbal cues for sequence and RW position, use of UEs to offload LLE as needed for pain control. posterior LOB x1 with min assist to recover, one standing rest d/t pain   Stairs             Wheelchair Mobility    Modified Rankin (Stroke Patients Only)       Balance   Sitting-balance support: Single extremity supported, No upper extremity supported, Feet supported Sitting balance-Leahy Scale: Fair     Standing balance support: Reliant on assistive device for balance, During functional activity Standing balance-Leahy Scale: Poor                              Cognition Arousal/Alertness: Awake/alert Behavior During Therapy: WFL for tasks assessed/performed Overall Cognitive Status: Within Functional Limits for tasks assessed  Exercises Total Joint Exercises Ankle Circles/Pumps: AROM, Both, 5 reps Heel Slides: AAROM, AROM, Left, 10 reps    General Comments        Pertinent Vitals/Pain Pain Assessment Pain Assessment: 0-10 Pain Score: 4  Pain Location: L hip Pain Descriptors / Indicators: Grimacing, Sore Pain Intervention(s): Limited activity within patient's tolerance, Monitored during session, Premedicated before session, Repositioned (does not like ice)    Home Living                           Prior Function            PT Goals (current goals can now be found in the care plan section) Acute Rehab PT Goals Patient Stated Goal: go to rehab PT Goal Formulation: With patient Time For Goal Achievement: 06/20/22 Potential to Achieve Goals: Good Progress towards PT goals: Progressing toward goals    Frequency    Min 3X/week      PT Plan Current plan remains appropriate    Co-evaluation              AM-PAC PT "6 Clicks" Mobility   Outcome Measure  Help needed turning from your back to your side while in a flat bed without using bedrails?: A Little Help needed moving from lying on your back to sitting on the side of a flat bed without using bedrails?: A Little Help needed moving to and from a bed to a chair (including a wheelchair)?: A Little Help needed standing up from a chair using your arms (e.g., wheelchair or bedside chair)?: A Little Help needed to walk in hospital room?: A Little Help needed climbing 3-5 steps with a railing? : A Lot 6 Click Score: 17    End of Session Equipment Utilized During Treatment: Gait belt Activity Tolerance: Patient tolerated treatment well;Patient limited by fatigue Patient left: with call bell/phone within reach;in chair;with chair alarm set   PT Visit Diagnosis: Other abnormalities of gait and mobility (R26.89);Difficulty in walking, not elsewhere classified (R26.2)     Time: [id number] PT Time Calculation (min) (ACUTE ONLY): 21 min  Charges:  $Gait Training: 8-22 mins                     Baxter Flattery, PT  Acute Rehab Dept [facility]) [phone]  [facility] Weekend Pager [facility] only)  [phone]  06/08/2022    [facility] 06/08/2022, 10:09 AM

## 2022-06-08 NOTE — TOC Transition Note (Signed)
Transition of Care [facility]) - CM/SW Discharge Note   Patient Details  Name: Alexis Cox MRN: [id number] Date of Birth: [birth year]  [facility]) CM/SW Contact:  Lennart Pall, LCSW Phone Number: 06/08/2022, 11:28 AM   Clinical Narrative:    Pt and family have accepted SNF bed offer with [facility] and have received insurance authorization.  MD has medically cleared pt for dc today and pt/ daughter aware and agreeable.  PTAR called at 11:25am.  RN to call report to [phone].  No further TOC needs.   Final next level of care: Skilled Nursing Facility Barriers to Discharge: No Barriers Identified   Patient Goals and CMS Choice CMS Medicare.gov Compare Post Acute Care list provided to::  (Daughter) Choice offered to / list presented to : Adult Children  Discharge Placement     Existing PASRR number confirmed : 06/06/22          Patient chooses bed at: [facility] Patient to be transferred to facility by: [place] Name of family member notified: daughter, Santiago Glad Patient and family notified of of transfer: 06/08/22  Discharge Plan and Services Additional resources added to the After Visit Summary for   In-house Referral: Clinical Social Work   Post Acute Care Choice: [place]          DME Arranged: N/A DME Agency: NA                  Social Determinants of Health ([place]) Interventions SDOH Screenings   Food Insecurity: No Food Insecurity (06/04/2022)  Housing: Low Risk  (06/04/2022)  Transportation Needs: No Transportation Needs (06/04/2022)  Utilities: Not At Risk (06/04/2022)  Tobacco Use: Low Risk  (06/04/2022)     Readmission Risk Interventions    06/05/2022    1:31 PM  Readmission Risk Prevention Plan  Post Dischage Appt Complete  Medication Screening Complete  Transportation Screening Complete

## 2022-06-08 NOTE — Progress Notes (Signed)
RN called Hayley with [facility] and gave pt report.

## 2022-06-15 ENCOUNTER — Encounter: Payer: Medicare Other | Admitting: Physician Assistant

## 2022-06-18 ENCOUNTER — Ambulatory Visit (INDEPENDENT_AMBULATORY_CARE_PROVIDER_SITE_OTHER): Payer: Medicare Other | Admitting: Physician Assistant

## 2022-06-18 ENCOUNTER — Encounter: Payer: Self-pay | Admitting: Physician Assistant

## 2022-06-18 ENCOUNTER — Encounter (HOSPITAL_COMMUNITY): Payer: Medicare Other

## 2022-06-18 ENCOUNTER — Other Ambulatory Visit: Payer: Self-pay

## 2022-06-18 DIAGNOSIS — M25572 Pain in left ankle and joints of left foot: Secondary | ICD-10-CM

## 2022-06-18 DIAGNOSIS — Z96642 Presence of left artificial hip joint: Secondary | ICD-10-CM

## 2022-06-18 NOTE — Progress Notes (Signed)
HPI: Alexis Cox comes in today status post left total hip arthroplasty 06/05/2022.  Patient sustained a left hip displaced subcapital femoral neck fracture underwent total hip arthroplasty.  She states she is walking some at the facility she is at with a walker.  She still having a lot of pain particularly down in the lower leg.  7 negative increase chest pain shortness of breath.  She is on aspirin for DVT prophylaxis.  She continues to take oxycodone for pain.  She is stating that she is having left ankle pain also and that this has been ongoing since she woke up from surgery she is unsure if this occurred whenever she had her fall on 06/04/2022.  Physical exam: General well-developed well-nourished female no acute distress.  Bilateral lower extremities with pitting edema.  Calf tenderness left leg only.  Dorsiflexion plantarflexion bilateral ankles intact.  Left hip surgical incisions healing well no signs of infection wound dehiscence.  She has minimal discomfort with internal/external rotation left hip.  Radiographs: Left ankle 3 views: Shows talus well located within the ankle mortise.  Negative for diastases.  There is no evidence of acute fracture or any osseous reaction.  Impression: Status post left total hip arthroplasty 06/05/2022  Plan: Given her left calf pain and swelling in both legs recommend Doppler left lower extremity rule out DVT.  This was ordered to be done later today.  She is weightbearing as tolerated left lower extremity.  Staples were harvested Steri-Strips applied she is able to get the incision wet.  Will see her back in 1 month.  Recommend continue aspirin for another week and less Dopplers positive for DVT and then we will place her on appropriate anticoagulation.

## 2022-06-22 ENCOUNTER — Ambulatory Visit (HOSPITAL_COMMUNITY): Payer: Medicare Other

## 2022-06-22 ENCOUNTER — Telehealth: Payer: Self-pay

## 2022-06-22 ENCOUNTER — Ambulatory Visit (HOSPITAL_COMMUNITY)
Admission: RE | Admit: 2022-06-22 | Discharge: 2022-06-22 | Disposition: A | Discharge status: Home / Self Care | Payer: Medicare Other | Source: Ambulatory Visit | Attending: Physician Assistant | Admitting: Physician Assistant

## 2022-06-22 ENCOUNTER — Telehealth: Payer: Self-pay | Admitting: Orthopaedic Surgery

## 2022-06-22 DIAGNOSIS — Z96642 Presence of left artificial hip joint: Secondary | ICD-10-CM | POA: Diagnosis not present

## 2022-06-22 NOTE — Telephone Encounter (Signed)
Had a message from facility stating they couldn't take her to get doppler straight from appointment on the 14th because the driver had another patient. They cancelled the doppler for today stating that didn't have a driver for her. I told her multiple times that this is to check for a DVT and is very important that she has this asap instead of waiting for days. She needs to find a driver that this isn't just a regular orthopedic visit that can be reschedule this is to check for a DVT that could get much worse if she has one and is left untreated. I gave her the number for [facility] vascular and urged them to call TODAY and reschedule and get her back on the schedule TODAY.

## 2022-06-22 NOTE — Progress Notes (Signed)
Left lower extremity venous study completed.   Attempted to call preliminary results to Dr. Ainsley Spinner office.  Please see CV Procedures for preliminary results.  Ermagene Saidi, RVT  2:25 PM 06/22/22

## 2022-06-22 NOTE — Telephone Encounter (Signed)
Patient will be going today at 2:15 for DVT scan. Just an [place]

## 2022-06-26 ENCOUNTER — Ambulatory Visit (HOSPITAL_COMMUNITY): Payer: Medicare Other

## 2022-07-08 DIAGNOSIS — E119 Type 2 diabetes mellitus without complications: Secondary | ICD-10-CM | POA: Diagnosis not present

## 2022-07-16 ENCOUNTER — Ambulatory Visit (INDEPENDENT_AMBULATORY_CARE_PROVIDER_SITE_OTHER): Payer: Medicare Other | Admitting: Orthopaedic Surgery

## 2022-07-16 ENCOUNTER — Encounter: Payer: Self-pay | Admitting: Orthopaedic Surgery

## 2022-07-16 DIAGNOSIS — Z96642 Presence of left artificial hip joint: Secondary | ICD-10-CM

## 2022-07-16 NOTE — Progress Notes (Signed)
The patient is a 77 year old who is now about 6 weeks status post a left total hip arthroplasty to treat a fracture hip.  We actually replaced her right hip last year due to the same thing.  She is ambit with a cane and doing well overall.  She still just a little bit sore she states.  She has been on aspirin twice daily.  She can go down to once daily.  On exam both hips move smoothly and fluidly with just a little bit of pain.  There is just a mild seroma o her left hip that we will likely absorb with time.  Overall she looks great.  From my standpoint she will continue to increase her activities as comfort allows.  Will see her back in 3 months with just a standing low AP pelvis.  If there are issues before then they know to let us know.

## 2022-07-30 DIAGNOSIS — G4700 Insomnia, unspecified: Secondary | ICD-10-CM | POA: Diagnosis not present

## 2022-07-30 DIAGNOSIS — E7800 Pure hypercholesterolemia, unspecified: Secondary | ICD-10-CM | POA: Diagnosis not present

## 2022-07-30 DIAGNOSIS — N1831 Chronic kidney disease, stage 3a: Secondary | ICD-10-CM | POA: Diagnosis not present

## 2022-07-30 DIAGNOSIS — Z9181 History of falling: Secondary | ICD-10-CM | POA: Diagnosis not present

## 2022-07-30 DIAGNOSIS — K219 Gastro-esophageal reflux disease without esophagitis: Secondary | ICD-10-CM | POA: Diagnosis not present

## 2022-07-30 DIAGNOSIS — K7460 Unspecified cirrhosis of liver: Secondary | ICD-10-CM | POA: Diagnosis not present

## 2022-07-30 DIAGNOSIS — D649 Anemia, unspecified: Secondary | ICD-10-CM | POA: Diagnosis not present

## 2022-07-30 DIAGNOSIS — K5909 Other constipation: Secondary | ICD-10-CM | POA: Diagnosis not present

## 2022-07-30 DIAGNOSIS — R6889 Other general symptoms and signs: Secondary | ICD-10-CM | POA: Diagnosis not present

## 2022-07-30 DIAGNOSIS — E1169 Type 2 diabetes mellitus with other specified complication: Secondary | ICD-10-CM | POA: Diagnosis not present

## 2022-08-07 DIAGNOSIS — E119 Type 2 diabetes mellitus without complications: Secondary | ICD-10-CM | POA: Diagnosis not present

## 2022-09-06 DIAGNOSIS — E119 Type 2 diabetes mellitus without complications: Secondary | ICD-10-CM | POA: Diagnosis not present

## 2022-09-07 DIAGNOSIS — K766 Portal hypertension: Secondary | ICD-10-CM | POA: Diagnosis not present

## 2022-09-07 DIAGNOSIS — I10 Essential (primary) hypertension: Secondary | ICD-10-CM | POA: Diagnosis not present

## 2022-09-07 DIAGNOSIS — R161 Splenomegaly, not elsewhere classified: Secondary | ICD-10-CM | POA: Diagnosis not present

## 2022-09-07 DIAGNOSIS — R140 Abdominal distension (gaseous): Secondary | ICD-10-CM | POA: Diagnosis not present

## 2022-09-07 DIAGNOSIS — K7460 Unspecified cirrhosis of liver: Secondary | ICD-10-CM | POA: Diagnosis not present

## 2022-09-08 DIAGNOSIS — K7460 Unspecified cirrhosis of liver: Secondary | ICD-10-CM | POA: Diagnosis not present

## 2022-09-08 DIAGNOSIS — R188 Other ascites: Secondary | ICD-10-CM | POA: Diagnosis not present

## 2022-09-11 ENCOUNTER — Telehealth: Payer: Self-pay | Admitting: *Deleted

## 2022-09-11 NOTE — Telephone Encounter (Signed)
Transition Care Management Unsuccessful Follow-up Telephone Call  Date of discharge and from where:  [facility] 09/08/2022  Attempts:  1st Attempt  Reason for unsuccessful TCM follow-up call: No answer

## 2022-09-14 ENCOUNTER — Telehealth: Payer: Self-pay | Admitting: *Deleted

## 2022-09-14 NOTE — Telephone Encounter (Signed)
Transition Care Management Follow-up Telephone Call Date of discharge and from where: [facility] 09/08/2022 How have you been since you were released from the hospital? Feeling some better Any questions or concerns? No  Items Reviewed: Did the pt receive and understand the discharge instructions provided? Yes  Medications obtained and verified? No  Other? No  Any new allergies since your discharge? No  Dietary orders reviewed? No Do you have support at home? Yes     Follow up appointments reviewed:  PCP Hospital f/u appt confirmed? No  If she needs to go in she will  . Are transportation arrangements needed? No  If their condition worsens, is the pt aware to call PCP or go to the Emergency Dept.? Yes Was the patient provided with contact information for the PCP's office or ED? Yes Was to pt encouraged to call back with questions or concerns? Yes

## 2022-10-01 DIAGNOSIS — R188 Other ascites: Secondary | ICD-10-CM | POA: Diagnosis not present

## 2022-10-01 DIAGNOSIS — K7460 Unspecified cirrhosis of liver: Secondary | ICD-10-CM | POA: Diagnosis not present

## 2022-10-07 DIAGNOSIS — E119 Type 2 diabetes mellitus without complications: Secondary | ICD-10-CM | POA: Diagnosis not present

## 2022-10-15 ENCOUNTER — Encounter: Payer: Self-pay | Admitting: Orthopaedic Surgery

## 2022-10-15 ENCOUNTER — Other Ambulatory Visit (INDEPENDENT_AMBULATORY_CARE_PROVIDER_SITE_OTHER): Payer: Medicare Other

## 2022-10-15 ENCOUNTER — Ambulatory Visit: Payer: Medicare Other | Admitting: Orthopaedic Surgery

## 2022-10-15 DIAGNOSIS — Z96642 Presence of left artificial hip joint: Secondary | ICD-10-CM

## 2022-10-15 NOTE — Progress Notes (Signed)
The patient is now over 4 months status post a left direct anterior hip replacement.  We had replaced her right hip remotely in the past.  The left hip was replaced in March of this year after something fell on her and she then sustained a hard fall from what hit her and she suffered a displaced femoral neck fracture.  This was why she had to have her hip replaced.  She still ambulates with a cane and a walker on occasion.  She has balance and coordination issues as a result of her hip fracture.  She is 77.  On exam her hip moves smoothly and fluidly on the left and right sides.  Her leg lengths are equal.  An AP pelvis and lateral of the left hip shows both hip replacements are in good position and are well-seated without any complicating features.  At this point from orthopedic standpoint she is released.  I do feel it is likely a permanent disability in terms of her needing a cane or walker given her age and given the fracture of her hip that is led to her hip replacement.  Although the hip has been replaced.  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

## 2022-10-19 DIAGNOSIS — K7460 Unspecified cirrhosis of liver: Secondary | ICD-10-CM | POA: Diagnosis not present

## 2022-10-19 DIAGNOSIS — R188 Other ascites: Secondary | ICD-10-CM | POA: Diagnosis not present

## 2022-11-04 DIAGNOSIS — K5909 Other constipation: Secondary | ICD-10-CM | POA: Diagnosis not present

## 2022-11-04 DIAGNOSIS — K7460 Unspecified cirrhosis of liver: Secondary | ICD-10-CM | POA: Diagnosis not present

## 2022-11-04 DIAGNOSIS — D649 Anemia, unspecified: Secondary | ICD-10-CM | POA: Diagnosis not present

## 2022-11-04 DIAGNOSIS — E1169 Type 2 diabetes mellitus with other specified complication: Secondary | ICD-10-CM | POA: Diagnosis not present

## 2022-11-04 DIAGNOSIS — E7800 Pure hypercholesterolemia, unspecified: Secondary | ICD-10-CM | POA: Diagnosis not present

## 2022-11-04 DIAGNOSIS — N182 Chronic kidney disease, stage 2 (mild): Secondary | ICD-10-CM | POA: Diagnosis not present

## 2022-11-04 DIAGNOSIS — R188 Other ascites: Secondary | ICD-10-CM | POA: Diagnosis not present

## 2022-11-04 DIAGNOSIS — K219 Gastro-esophageal reflux disease without esophagitis: Secondary | ICD-10-CM | POA: Diagnosis not present

## 2022-11-06 DIAGNOSIS — R188 Other ascites: Secondary | ICD-10-CM | POA: Diagnosis not present

## 2022-11-07 DIAGNOSIS — E119 Type 2 diabetes mellitus without complications: Secondary | ICD-10-CM | POA: Diagnosis not present

## 2022-11-09 DIAGNOSIS — K7460 Unspecified cirrhosis of liver: Secondary | ICD-10-CM | POA: Diagnosis not present

## 2022-11-09 DIAGNOSIS — I8500 Esophageal varices without bleeding: Secondary | ICD-10-CM | POA: Diagnosis not present

## 2022-11-09 DIAGNOSIS — K219 Gastro-esophageal reflux disease without esophagitis: Secondary | ICD-10-CM | POA: Diagnosis not present

## 2022-11-09 DIAGNOSIS — R188 Other ascites: Secondary | ICD-10-CM | POA: Diagnosis not present

## 2022-11-16 DIAGNOSIS — K7460 Unspecified cirrhosis of liver: Secondary | ICD-10-CM | POA: Diagnosis not present

## 2022-11-16 DIAGNOSIS — R935 Abnormal findings on diagnostic imaging of other abdominal regions, including retroperitoneum: Secondary | ICD-10-CM | POA: Diagnosis not present

## 2022-11-18 DIAGNOSIS — E119 Type 2 diabetes mellitus without complications: Secondary | ICD-10-CM | POA: Diagnosis not present

## 2022-11-23 DIAGNOSIS — R772 Abnormality of alphafetoprotein: Secondary | ICD-10-CM | POA: Diagnosis not present

## 2022-11-23 DIAGNOSIS — I8500 Esophageal varices without bleeding: Secondary | ICD-10-CM | POA: Diagnosis not present

## 2022-11-23 DIAGNOSIS — K7460 Unspecified cirrhosis of liver: Secondary | ICD-10-CM | POA: Diagnosis not present

## 2022-11-23 DIAGNOSIS — R188 Other ascites: Secondary | ICD-10-CM | POA: Diagnosis not present

## 2022-11-23 DIAGNOSIS — E611 Iron deficiency: Secondary | ICD-10-CM | POA: Diagnosis not present

## 2022-11-24 DIAGNOSIS — K7460 Unspecified cirrhosis of liver: Secondary | ICD-10-CM | POA: Diagnosis not present

## 2022-11-24 DIAGNOSIS — R188 Other ascites: Secondary | ICD-10-CM | POA: Diagnosis not present

## 2022-11-25 DIAGNOSIS — K7460 Unspecified cirrhosis of liver: Secondary | ICD-10-CM | POA: Diagnosis not present

## 2022-11-26 DIAGNOSIS — R188 Other ascites: Secondary | ICD-10-CM | POA: Diagnosis not present

## 2022-11-26 DIAGNOSIS — K7460 Unspecified cirrhosis of liver: Secondary | ICD-10-CM | POA: Diagnosis not present

## 2022-11-26 DIAGNOSIS — R1013 Epigastric pain: Secondary | ICD-10-CM | POA: Diagnosis not present

## 2022-12-08 DIAGNOSIS — D649 Anemia, unspecified: Secondary | ICD-10-CM | POA: Diagnosis not present

## 2022-12-08 DIAGNOSIS — E119 Type 2 diabetes mellitus without complications: Secondary | ICD-10-CM | POA: Diagnosis not present

## 2022-12-23 DIAGNOSIS — R188 Other ascites: Secondary | ICD-10-CM | POA: Diagnosis not present

## 2022-12-23 DIAGNOSIS — K7460 Unspecified cirrhosis of liver: Secondary | ICD-10-CM | POA: Diagnosis not present

## 2022-12-23 DIAGNOSIS — K219 Gastro-esophageal reflux disease without esophagitis: Secondary | ICD-10-CM | POA: Diagnosis not present

## 2022-12-23 DIAGNOSIS — I8500 Esophageal varices without bleeding: Secondary | ICD-10-CM | POA: Diagnosis not present

## 2023-01-07 DIAGNOSIS — D510 Vitamin B12 deficiency anemia due to intrinsic factor deficiency: Secondary | ICD-10-CM | POA: Diagnosis not present

## 2023-01-07 DIAGNOSIS — K7460 Unspecified cirrhosis of liver: Secondary | ICD-10-CM | POA: Diagnosis not present

## 2023-01-07 DIAGNOSIS — D649 Anemia, unspecified: Secondary | ICD-10-CM | POA: Diagnosis not present

## 2023-01-08 DIAGNOSIS — E119 Type 2 diabetes mellitus without complications: Secondary | ICD-10-CM | POA: Diagnosis not present

## 2023-01-21 DIAGNOSIS — I8510 Secondary esophageal varices without bleeding: Secondary | ICD-10-CM | POA: Diagnosis not present

## 2023-01-21 DIAGNOSIS — K7460 Unspecified cirrhosis of liver: Secondary | ICD-10-CM | POA: Diagnosis not present

## 2023-01-21 DIAGNOSIS — D649 Anemia, unspecified: Secondary | ICD-10-CM | POA: Diagnosis not present

## 2023-01-21 DIAGNOSIS — I8500 Esophageal varices without bleeding: Secondary | ICD-10-CM | POA: Diagnosis not present

## 2023-01-21 DIAGNOSIS — K219 Gastro-esophageal reflux disease without esophagitis: Secondary | ICD-10-CM | POA: Diagnosis not present

## 2023-02-08 DIAGNOSIS — E7800 Pure hypercholesterolemia, unspecified: Secondary | ICD-10-CM | POA: Diagnosis not present

## 2023-02-08 DIAGNOSIS — E119 Type 2 diabetes mellitus without complications: Secondary | ICD-10-CM | POA: Diagnosis not present

## 2023-02-08 DIAGNOSIS — D649 Anemia, unspecified: Secondary | ICD-10-CM | POA: Diagnosis not present

## 2023-02-08 DIAGNOSIS — K219 Gastro-esophageal reflux disease without esophagitis: Secondary | ICD-10-CM | POA: Diagnosis not present

## 2023-02-08 DIAGNOSIS — Z23 Encounter for immunization: Secondary | ICD-10-CM | POA: Diagnosis not present

## 2023-02-08 DIAGNOSIS — N182 Chronic kidney disease, stage 2 (mild): Secondary | ICD-10-CM | POA: Diagnosis not present

## 2023-02-08 DIAGNOSIS — E1169 Type 2 diabetes mellitus with other specified complication: Secondary | ICD-10-CM | POA: Diagnosis not present

## 2023-02-08 DIAGNOSIS — R188 Other ascites: Secondary | ICD-10-CM | POA: Diagnosis not present

## 2023-02-08 DIAGNOSIS — K7460 Unspecified cirrhosis of liver: Secondary | ICD-10-CM | POA: Diagnosis not present

## 2023-03-11 DIAGNOSIS — E119 Type 2 diabetes mellitus without complications: Secondary | ICD-10-CM | POA: Diagnosis not present

## 2023-04-06 DIAGNOSIS — E119 Type 2 diabetes mellitus without complications: Secondary | ICD-10-CM | POA: Diagnosis not present

## 2023-04-19 ENCOUNTER — Encounter (HOSPITAL_COMMUNITY): Payer: Self-pay | Admitting: Specialist

## 2023-04-19 ENCOUNTER — Other Ambulatory Visit (HOSPITAL_COMMUNITY): Payer: Self-pay | Admitting: Specialist

## 2023-04-19 DIAGNOSIS — K7460 Unspecified cirrhosis of liver: Secondary | ICD-10-CM | POA: Diagnosis not present

## 2023-04-19 DIAGNOSIS — R195 Other fecal abnormalities: Secondary | ICD-10-CM | POA: Diagnosis not present

## 2023-04-19 DIAGNOSIS — R188 Other ascites: Secondary | ICD-10-CM | POA: Diagnosis not present

## 2023-04-19 DIAGNOSIS — K219 Gastro-esophageal reflux disease without esophagitis: Secondary | ICD-10-CM | POA: Diagnosis not present

## 2023-04-19 DIAGNOSIS — I8500 Esophageal varices without bleeding: Secondary | ICD-10-CM | POA: Diagnosis not present

## 2023-04-26 ENCOUNTER — Ambulatory Visit (HOSPITAL_COMMUNITY)
Admission: RE | Admit: 2023-04-26 | Discharge: 2023-04-26 | Disposition: A | Discharge status: Home / Self Care | Payer: No Typology Code available for payment source | Source: Ambulatory Visit | Attending: Specialist | Admitting: Specialist

## 2023-04-26 DIAGNOSIS — R188 Other ascites: Secondary | ICD-10-CM | POA: Insufficient documentation

## 2023-04-26 DIAGNOSIS — K7460 Unspecified cirrhosis of liver: Secondary | ICD-10-CM | POA: Insufficient documentation

## 2023-04-26 HISTORY — PX: IR PARACENTESIS: IMG2679

## 2023-04-26 LAB — BODY FLUID CELL COUNT WITH DIFFERENTIAL
Eos, Fluid: 0 %
Lymphs, Fluid: 63 %
Monocyte-Macrophage-Serous Fluid: 33 % — ABNORMAL LOW (ref 50–90)
Neutrophil Count, Fluid: 4 % (ref 0–25)
Total Nucleated Cell Count, Fluid: 202 uL (ref 0–1000)

## 2023-04-26 MED ORDER — ALBUMIN HUMAN 25 % IV SOLN
50.0000 g | Freq: Once | INTRAVENOUS | Status: AC
  Administered 2023-04-26: 50 g via INTRAVENOUS

## 2023-04-26 MED ORDER — LIDOCAINE HCL 1 % IJ SOLN
20.0000 mL | Freq: Once | INTRAMUSCULAR | Status: AC
  Administered 2023-04-26: 10 mL

## 2023-04-26 MED ORDER — LIDOCAINE HCL 1 % IJ SOLN
INTRAMUSCULAR | Status: AC
  Filled 2023-04-26: qty 20

## 2023-04-26 MED ORDER — ALBUMIN HUMAN 25 % IV SOLN
INTRAVENOUS | Status: AC
  Filled 2023-04-26: qty 200

## 2023-04-26 NOTE — Procedures (Signed)
PROCEDURE SUMMARY:  Successful image-guided paracentesis from the left lower abdomen.  Yielded 5.8 liters of hazy yellow fluid.  No immediate complications.  EBL = trace. Patient tolerated well.   Specimen was not sent for labs.  Please see imaging section of Epic for full dictation.  If the patient eventually requires >/=2 paracenteses in a 30 day period, screening evaluation by the [facility] will be assessed.   Lindbergh Winkles H Jaykob Minichiello PA-C 04/26/2023 1:12 PM

## 2023-04-27 LAB — PATHOLOGIST SMEAR REVIEW: Path Review: REACTIVE

## 2023-05-06 DIAGNOSIS — E119 Type 2 diabetes mellitus without complications: Secondary | ICD-10-CM | POA: Diagnosis not present

## 2023-05-25 DIAGNOSIS — E119 Type 2 diabetes mellitus without complications: Secondary | ICD-10-CM | POA: Diagnosis not present

## 2023-05-27 DIAGNOSIS — F03A3 Unspecified dementia, mild, with mood disturbance: Secondary | ICD-10-CM | POA: Diagnosis not present

## 2023-05-27 DIAGNOSIS — D509 Iron deficiency anemia, unspecified: Secondary | ICD-10-CM | POA: Diagnosis not present

## 2023-05-27 DIAGNOSIS — T68XXXA Hypothermia, initial encounter: Secondary | ICD-10-CM | POA: Diagnosis not present

## 2023-05-27 DIAGNOSIS — E876 Hypokalemia: Secondary | ICD-10-CM | POA: Diagnosis not present

## 2023-05-27 DIAGNOSIS — I9589 Other hypotension: Secondary | ICD-10-CM | POA: Diagnosis not present

## 2023-05-27 DIAGNOSIS — K219 Gastro-esophageal reflux disease without esophagitis: Secondary | ICD-10-CM | POA: Diagnosis not present

## 2023-05-27 DIAGNOSIS — D684 Acquired coagulation factor deficiency: Secondary | ICD-10-CM | POA: Diagnosis not present

## 2023-05-27 DIAGNOSIS — K7460 Unspecified cirrhosis of liver: Secondary | ICD-10-CM | POA: Diagnosis not present

## 2023-05-27 DIAGNOSIS — R197 Diarrhea, unspecified: Secondary | ICD-10-CM | POA: Diagnosis not present

## 2023-05-27 DIAGNOSIS — I959 Hypotension, unspecified: Secondary | ICD-10-CM | POA: Diagnosis not present

## 2023-05-27 DIAGNOSIS — R918 Other nonspecific abnormal finding of lung field: Secondary | ICD-10-CM | POA: Diagnosis not present

## 2023-05-27 DIAGNOSIS — K7200 Acute and subacute hepatic failure without coma: Secondary | ICD-10-CM | POA: Diagnosis not present

## 2023-05-27 DIAGNOSIS — M1990 Unspecified osteoarthritis, unspecified site: Secondary | ICD-10-CM | POA: Diagnosis not present

## 2023-05-27 DIAGNOSIS — N179 Acute kidney failure, unspecified: Secondary | ICD-10-CM | POA: Diagnosis not present

## 2023-05-27 DIAGNOSIS — R5381 Other malaise: Secondary | ICD-10-CM | POA: Diagnosis not present

## 2023-05-27 DIAGNOSIS — K3189 Other diseases of stomach and duodenum: Secondary | ICD-10-CM | POA: Diagnosis not present

## 2023-05-27 DIAGNOSIS — I4891 Unspecified atrial fibrillation: Secondary | ICD-10-CM | POA: Diagnosis not present

## 2023-05-27 DIAGNOSIS — R627 Adult failure to thrive: Secondary | ICD-10-CM | POA: Diagnosis not present

## 2023-05-27 DIAGNOSIS — R6520 Severe sepsis without septic shock: Secondary | ICD-10-CM | POA: Diagnosis not present

## 2023-05-27 DIAGNOSIS — R188 Other ascites: Secondary | ICD-10-CM | POA: Diagnosis not present

## 2023-05-27 DIAGNOSIS — Z681 Body mass index (BMI) 19 or less, adult: Secondary | ICD-10-CM | POA: Diagnosis not present

## 2023-05-27 DIAGNOSIS — E7800 Pure hypercholesterolemia, unspecified: Secondary | ICD-10-CM | POA: Diagnosis not present

## 2023-05-27 DIAGNOSIS — K7290 Hepatic failure, unspecified without coma: Secondary | ICD-10-CM | POA: Diagnosis not present

## 2023-05-27 DIAGNOSIS — A4151 Sepsis due to Escherichia coli [E. coli]: Secondary | ICD-10-CM | POA: Diagnosis not present

## 2023-05-27 DIAGNOSIS — K652 Spontaneous bacterial peritonitis: Secondary | ICD-10-CM | POA: Diagnosis not present

## 2023-05-27 DIAGNOSIS — E43 Unspecified severe protein-calorie malnutrition: Secondary | ICD-10-CM | POA: Diagnosis not present

## 2023-05-27 DIAGNOSIS — K767 Hepatorenal syndrome: Secondary | ICD-10-CM | POA: Diagnosis not present

## 2023-05-27 DIAGNOSIS — D490 Neoplasm of unspecified behavior of digestive system: Secondary | ICD-10-CM | POA: Diagnosis not present

## 2023-05-27 DIAGNOSIS — E872 Acidosis, unspecified: Secondary | ICD-10-CM | POA: Diagnosis not present

## 2023-05-27 DIAGNOSIS — D696 Thrombocytopenia, unspecified: Secondary | ICD-10-CM | POA: Diagnosis not present

## 2023-05-27 DIAGNOSIS — R531 Weakness: Secondary | ICD-10-CM | POA: Diagnosis not present

## 2023-05-27 DIAGNOSIS — E860 Dehydration: Secondary | ICD-10-CM | POA: Diagnosis not present

## 2023-05-27 DIAGNOSIS — R110 Nausea: Secondary | ICD-10-CM | POA: Diagnosis not present

## 2023-05-27 DIAGNOSIS — E871 Hypo-osmolality and hyponatremia: Secondary | ICD-10-CM | POA: Diagnosis not present

## 2023-05-28 DIAGNOSIS — K7031 Alcoholic cirrhosis of liver with ascites: Secondary | ICD-10-CM | POA: Diagnosis not present

## 2023-05-28 DIAGNOSIS — A419 Sepsis, unspecified organism: Secondary | ICD-10-CM | POA: Diagnosis not present

## 2023-06-09 DIAGNOSIS — K5904 Chronic idiopathic constipation: Secondary | ICD-10-CM | POA: Diagnosis not present

## 2023-06-09 DIAGNOSIS — Z7401 Bed confinement status: Secondary | ICD-10-CM | POA: Diagnosis not present

## 2023-06-09 DIAGNOSIS — K652 Spontaneous bacterial peritonitis: Secondary | ICD-10-CM | POA: Diagnosis not present

## 2023-06-09 DIAGNOSIS — D508 Other iron deficiency anemias: Secondary | ICD-10-CM | POA: Diagnosis not present

## 2023-06-09 DIAGNOSIS — K7460 Unspecified cirrhosis of liver: Secondary | ICD-10-CM | POA: Diagnosis not present

## 2023-06-09 DIAGNOSIS — D696 Thrombocytopenia, unspecified: Secondary | ICD-10-CM | POA: Diagnosis not present

## 2023-06-09 DIAGNOSIS — K7469 Other cirrhosis of liver: Secondary | ICD-10-CM | POA: Diagnosis not present

## 2023-06-09 DIAGNOSIS — K3189 Other diseases of stomach and duodenum: Secondary | ICD-10-CM | POA: Diagnosis not present

## 2023-06-09 DIAGNOSIS — K766 Portal hypertension: Secondary | ICD-10-CM | POA: Diagnosis not present

## 2023-06-09 DIAGNOSIS — R2681 Unsteadiness on feet: Secondary | ICD-10-CM | POA: Diagnosis not present

## 2023-06-09 DIAGNOSIS — M6259 Muscle wasting and atrophy, not elsewhere classified, multiple sites: Secondary | ICD-10-CM | POA: Diagnosis not present

## 2023-06-09 DIAGNOSIS — Z743 Need for continuous supervision: Secondary | ICD-10-CM | POA: Diagnosis not present

## 2023-06-09 DIAGNOSIS — R2689 Other abnormalities of gait and mobility: Secondary | ICD-10-CM | POA: Diagnosis not present

## 2023-06-09 DIAGNOSIS — K7290 Hepatic failure, unspecified without coma: Secondary | ICD-10-CM | POA: Diagnosis not present

## 2023-06-09 DIAGNOSIS — M6289 Other specified disorders of muscle: Secondary | ICD-10-CM | POA: Diagnosis not present

## 2023-06-09 DIAGNOSIS — M6281 Muscle weakness (generalized): Secondary | ICD-10-CM | POA: Diagnosis not present

## 2023-06-09 DIAGNOSIS — R188 Other ascites: Secondary | ICD-10-CM | POA: Diagnosis not present

## 2023-06-09 DIAGNOSIS — R634 Abnormal weight loss: Secondary | ICD-10-CM | POA: Diagnosis not present

## 2023-06-09 DIAGNOSIS — R410 Disorientation, unspecified: Secondary | ICD-10-CM | POA: Diagnosis not present

## 2023-06-09 DIAGNOSIS — R293 Abnormal posture: Secondary | ICD-10-CM | POA: Diagnosis not present

## 2023-06-09 DIAGNOSIS — D509 Iron deficiency anemia, unspecified: Secondary | ICD-10-CM | POA: Diagnosis not present

## 2023-06-10 DIAGNOSIS — D696 Thrombocytopenia, unspecified: Secondary | ICD-10-CM | POA: Diagnosis not present

## 2023-06-10 DIAGNOSIS — R634 Abnormal weight loss: Secondary | ICD-10-CM | POA: Diagnosis not present

## 2023-06-10 DIAGNOSIS — D509 Iron deficiency anemia, unspecified: Secondary | ICD-10-CM | POA: Diagnosis not present

## 2023-06-10 DIAGNOSIS — K3189 Other diseases of stomach and duodenum: Secondary | ICD-10-CM | POA: Diagnosis not present

## 2023-06-10 DIAGNOSIS — K5904 Chronic idiopathic constipation: Secondary | ICD-10-CM | POA: Diagnosis not present

## 2023-06-10 DIAGNOSIS — K7460 Unspecified cirrhosis of liver: Secondary | ICD-10-CM | POA: Diagnosis not present

## 2023-06-10 DIAGNOSIS — K7290 Hepatic failure, unspecified without coma: Secondary | ICD-10-CM | POA: Diagnosis not present

## 2023-06-10 DIAGNOSIS — K766 Portal hypertension: Secondary | ICD-10-CM | POA: Diagnosis not present

## 2023-06-28 DIAGNOSIS — D508 Other iron deficiency anemias: Secondary | ICD-10-CM | POA: Diagnosis not present

## 2023-07-01 DIAGNOSIS — R1311 Dysphagia, oral phase: Secondary | ICD-10-CM | POA: Diagnosis not present

## 2023-07-01 DIAGNOSIS — K5904 Chronic idiopathic constipation: Secondary | ICD-10-CM | POA: Diagnosis not present

## 2023-07-01 DIAGNOSIS — D509 Iron deficiency anemia, unspecified: Secondary | ICD-10-CM | POA: Diagnosis not present

## 2023-07-02 DIAGNOSIS — R1311 Dysphagia, oral phase: Secondary | ICD-10-CM | POA: Diagnosis not present

## 2023-07-03 DIAGNOSIS — R1311 Dysphagia, oral phase: Secondary | ICD-10-CM | POA: Diagnosis not present

## 2023-07-04 DIAGNOSIS — R1311 Dysphagia, oral phase: Secondary | ICD-10-CM | POA: Diagnosis not present

## 2023-07-05 DIAGNOSIS — R1311 Dysphagia, oral phase: Secondary | ICD-10-CM | POA: Diagnosis not present

## 2023-07-06 DIAGNOSIS — D508 Other iron deficiency anemias: Secondary | ICD-10-CM | POA: Diagnosis not present

## 2023-07-06 DIAGNOSIS — R1311 Dysphagia, oral phase: Secondary | ICD-10-CM | POA: Diagnosis not present

## 2023-07-08 DIAGNOSIS — R1311 Dysphagia, oral phase: Secondary | ICD-10-CM | POA: Diagnosis not present

## 2023-07-12 DIAGNOSIS — R1311 Dysphagia, oral phase: Secondary | ICD-10-CM | POA: Diagnosis not present

## 2023-07-12 DIAGNOSIS — K766 Portal hypertension: Secondary | ICD-10-CM | POA: Diagnosis not present

## 2023-07-12 DIAGNOSIS — M549 Dorsalgia, unspecified: Secondary | ICD-10-CM | POA: Diagnosis not present

## 2023-07-12 DIAGNOSIS — K7290 Hepatic failure, unspecified without coma: Secondary | ICD-10-CM | POA: Diagnosis not present

## 2023-07-13 DIAGNOSIS — R1311 Dysphagia, oral phase: Secondary | ICD-10-CM | POA: Diagnosis not present

## 2023-07-13 DIAGNOSIS — D508 Other iron deficiency anemias: Secondary | ICD-10-CM | POA: Diagnosis not present

## 2023-07-14 DIAGNOSIS — R1311 Dysphagia, oral phase: Secondary | ICD-10-CM | POA: Diagnosis not present

## 2023-07-14 DIAGNOSIS — K7469 Other cirrhosis of liver: Secondary | ICD-10-CM | POA: Diagnosis not present

## 2023-07-15 DIAGNOSIS — L89520 Pressure ulcer of left ankle, unstageable: Secondary | ICD-10-CM | POA: Diagnosis not present

## 2023-07-15 DIAGNOSIS — Z681 Body mass index (BMI) 19 or less, adult: Secondary | ICD-10-CM | POA: Diagnosis not present

## 2023-07-15 DIAGNOSIS — M6281 Muscle weakness (generalized): Secondary | ICD-10-CM | POA: Diagnosis not present

## 2023-07-15 DIAGNOSIS — R1311 Dysphagia, oral phase: Secondary | ICD-10-CM | POA: Diagnosis not present

## 2023-07-15 DIAGNOSIS — D508 Other iron deficiency anemias: Secondary | ICD-10-CM | POA: Diagnosis not present

## 2023-07-18 DIAGNOSIS — R1311 Dysphagia, oral phase: Secondary | ICD-10-CM | POA: Diagnosis not present

## 2023-07-20 DIAGNOSIS — D508 Other iron deficiency anemias: Secondary | ICD-10-CM | POA: Diagnosis not present

## 2023-07-22 DIAGNOSIS — L89152 Pressure ulcer of sacral region, stage 2: Secondary | ICD-10-CM | POA: Diagnosis not present

## 2023-07-22 DIAGNOSIS — D508 Other iron deficiency anemias: Secondary | ICD-10-CM | POA: Diagnosis not present

## 2023-07-22 DIAGNOSIS — M6281 Muscle weakness (generalized): Secondary | ICD-10-CM | POA: Diagnosis not present

## 2023-07-22 DIAGNOSIS — Z681 Body mass index (BMI) 19 or less, adult: Secondary | ICD-10-CM | POA: Diagnosis not present

## 2023-07-27 DIAGNOSIS — D508 Other iron deficiency anemias: Secondary | ICD-10-CM | POA: Diagnosis not present

## 2023-09-05 DEATH — deceased
